# Patient Record
Sex: Female | Born: 1990 | Race: Black or African American | Hispanic: No | Marital: Single | Smoking: Never smoker
Health system: Southern US, Community
[De-identification: ages and names within clinical notes are randomized; demographics above are authoritative.]

## PROBLEM LIST (undated history)

## (undated) ENCOUNTER — Inpatient Hospital Stay (HOSPITAL_COMMUNITY): Payer: Self-pay

## (undated) DIAGNOSIS — R87629 Unspecified abnormal cytological findings in specimens from vagina: Secondary | ICD-10-CM

## (undated) DIAGNOSIS — I890 Lymphedema, not elsewhere classified: Secondary | ICD-10-CM

## (undated) DIAGNOSIS — O139 Gestational [pregnancy-induced] hypertension without significant proteinuria, unspecified trimester: Secondary | ICD-10-CM

---
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
## 2012-07-19 HISTORY — PX: CERVICAL BIOPSY: SHX590

## 2013-06-10 ENCOUNTER — Inpatient Hospital Stay (HOSPITAL_BASED_OUTPATIENT_CLINIC_OR_DEPARTMENT_OTHER)
Admission: EM | Admit: 2013-06-10 | Discharge: 2013-06-10 | Disposition: A | Discharge status: Home / Self Care | Payer: Managed Care, Other (non HMO) | Attending: Emergency Medicine | Admitting: Emergency Medicine

## 2013-06-10 ENCOUNTER — Inpatient Hospital Stay (HOSPITAL_COMMUNITY): Payer: Managed Care, Other (non HMO)

## 2013-06-10 DIAGNOSIS — R1031 Right lower quadrant pain: Secondary | ICD-10-CM | POA: Insufficient documentation

## 2013-06-10 DIAGNOSIS — O210 Mild hyperemesis gravidarum: Secondary | ICD-10-CM | POA: Insufficient documentation

## 2013-06-10 DIAGNOSIS — R5381 Other malaise: Secondary | ICD-10-CM | POA: Insufficient documentation

## 2013-06-10 DIAGNOSIS — O9989 Other specified diseases and conditions complicating pregnancy, childbirth and the puerperium: Secondary | ICD-10-CM | POA: Insufficient documentation

## 2013-06-10 DIAGNOSIS — Z3490 Encounter for supervision of normal pregnancy, unspecified, unspecified trimester: Secondary | ICD-10-CM

## 2013-06-10 DIAGNOSIS — N949 Unspecified condition associated with female genital organs and menstrual cycle: Secondary | ICD-10-CM

## 2013-06-10 DIAGNOSIS — R1032 Left lower quadrant pain: Secondary | ICD-10-CM | POA: Insufficient documentation

## 2013-06-10 DIAGNOSIS — R1084 Generalized abdominal pain: Secondary | ICD-10-CM

## 2013-06-10 DIAGNOSIS — A5609 Other chlamydial infection of lower genitourinary tract: Secondary | ICD-10-CM

## 2013-06-10 DIAGNOSIS — Z792 Long term (current) use of antibiotics: Secondary | ICD-10-CM | POA: Insufficient documentation

## 2013-06-10 DIAGNOSIS — O209 Hemorrhage in early pregnancy, unspecified: Secondary | ICD-10-CM | POA: Insufficient documentation

## 2013-06-10 LAB — URINALYSIS, ROUTINE W REFLEX MICROSCOPIC
Bilirubin Urine: NEGATIVE
Ketones, ur: 15 mg/dL — AB
Nitrite: NEGATIVE
Urobilinogen, UA: 1 mg/dL (ref 0.0–1.0)

## 2013-06-10 LAB — WET PREP, GENITAL
Trich, Wet Prep: NONE SEEN
Yeast Wet Prep HPF POC: NONE SEEN

## 2013-06-10 LAB — URINE MICROSCOPIC-ADD ON

## 2013-06-10 MED ORDER — COMPLETENATE 29-1 MG PO CHEW: 1.0000 | CHEWABLE_TABLET | Freq: Every day | ORAL | Status: AC

## 2013-06-10 NOTE — MAU Provider Note (Signed)
History     CSN: [id number]  Arrival date and time: 06/10/13 1501   None     Chief Complaint  Patient presents with  . Abdominal Pain   Abdominal Pain    Tammy Berg is a 22 y.o. who was seen at [facility] today. She had a complete physical exam, and labs. She was instructed to return to [facility] tomorrow for an outpatient ultrasound. She is here now, and reports cramping and bleeding. She states that the bleeding stopped two days ago.    Note and labs from [facility] fully reviewed.  No past medical history on file.  No past surgical history on file.  No family history on file.  History  Substance Use Topics  . Smoking status: Not on file  . Smokeless tobacco: Not on file  . Alcohol Use: Not on file    Allergies: No Known Allergies  Prescriptions prior to admission  Medication Sig Dispense Refill  . metroNIDAZOLE (FLAGYL) 500 MG tablet Take 500 mg by mouth 3 (three) times daily.        Review of Systems  Gastrointestinal: Positive for abdominal pain.   Physical Exam   Blood pressure 114/63, pulse 72, temperature 97.8 F (36.6 C), temperature source Oral, resp. rate 18, weight 91.627 kg (202 lb), last menstrual period 04/21/2013, SpO2 100.00%.  Physical Exam  Nursing note and vitals reviewed. Constitutional: She is oriented to person, place, and time. She appears well-developed and well-nourished. No distress.  Cardiovascular: Normal rate.   Respiratory: Effort normal.  Neurological: She is alert and oriented to person, place, and time.  Skin: Skin is warm and dry.  Psychiatric: She has a normal mood and affect.    MAU Course  Procedures  Results for orders placed during the hospital encounter of 06/10/13 (from the past 24 hour(s))  URINALYSIS, ROUTINE W REFLEX MICROSCOPIC     Status: Abnormal   Collection Time    06/10/13  3:15 PM      Result Value Range   Color, Urine AMBER (*) YELLOW   APPearance CLEAR  CLEAR   Specific Gravity, Urine 1.024  1.005 - 1.030   pH 6.0  5.0 - 8.0   Glucose, UA NEGATIVE  NEGATIVE mg/dL   Hgb urine dipstick NEGATIVE  NEGATIVE   Bilirubin Urine NEGATIVE  NEGATIVE   Ketones, ur 15 (*) NEGATIVE mg/dL   Protein, ur NEGATIVE  NEGATIVE mg/dL   Urobilinogen, UA 1.0  0.0 - 1.0 mg/dL   Nitrite NEGATIVE  NEGATIVE   Leukocytes, UA SMALL (*) NEGATIVE  PREGNANCY, URINE     Status: Abnormal   Collection Time    06/10/13  3:15 PM      Result Value Range   Preg Test, Ur POSITIVE (*) NEGATIVE  URINE MICROSCOPIC-ADD ON     Status: Abnormal   Collection Time    06/10/13  3:15 PM      Result Value Range   Squamous Epithelial / LPF RARE  RARE   WBC, UA 0-2  <3 WBC/hpf   Bacteria, UA MANY (*) RARE   Urine-Other MUCOUS PRESENT    WET PREP, GENITAL     Status: Abnormal   Collection Time    06/10/13  5:48 PM      Result Value Range   Yeast Wet Prep HPF POC NONE SEEN  NONE SEEN   Trich, Wet Prep NONE SEEN  NONE SEEN   Clue Cells Wet Prep HPF POC FEW (*) NONE SEEN   WBC, Wet Prep  HPF POC MANY (*) NONE SEEN  HCG, QUANTITATIVE, PREGNANCY     Status: Abnormal   Collection Time    06/10/13  5:55 PM      Result Value Range   hCG, Beta Chain, [place] [id number] (*) <5 mIU/mL  ABO/RH     Status: None   Collection Time    06/10/13  8:27 PM      Result Value Range   ABO/RH(D) O POS     US Ob Comp Less 14 Wks  06/10/2013   CLINICAL DATA:  First trimester pregnancy. Vaginal bleeding. LMP 04/21/2013.  EXAM: OBSTETRIC <14 WK ULTRASOUND  TECHNIQUE: Transabdominal ultrasound was performed for evaluation of the gestation as well as the maternal uterus and adnexal regions.  COMPARISON:  None.  FINDINGS: Intrauterine gestational sac: Visualized/normal in shape.  Yolk sac:  Visualized.  Embryo:  Visualized.  Cardiac Activity: Visualized.  Heart Rate: 118 bpm  CRL:   4.9  mm   6 w 2 d                  [place] EDC: 02/01/2014  Maternal uterus/adnexae: There is no evidence of subchorionic hematoma. Both maternal ovaries appear normal. There is no adnexal  mass or free pelvic fluid.  IMPRESSION: Single living intrauterine pregnancy with best estimated gestational age of [redacted] weeks 2 days. No acute findings demonstrated.   Electronically Signed   By: Roxy Horseman M.D.   On: 06/10/2013 21:03     Assessment and Plan   1. Pregnant   2. Pelvic pain complicating pregnancy, antepartum, first trimester    First trimester danger signs reviewed Start [facility] as soon as possible.   Tawnya Crook 06/10/2013, 9:00 PM

## 2013-06-10 NOTE — ED Notes (Signed)
Patient here with complaints of general abdominal pain with dysuria. Reports that the dysuria has now resolved. Nausea with some vomiting. Reports that she has just found out she was pregnant.

## 2013-06-10 NOTE — ED Provider Notes (Signed)
CSN: [id number]     Arrival date & time 06/10/13  1501 History   First MD Initiated Contact with Patient 06/10/13 1609     Chief Complaint  Patient presents with  . Abdominal Pain   (Consider location/radiation/quality/duration/timing/severity/associated sxs/prior Treatment) Patient is a 22 y.o. female presenting with abdominal pain and vaginal bleeding. The history is provided by the patient. No language interpreter was used.  Abdominal Pain Pain location:  LLQ and RLQ Pain quality: cramping   Pain radiates to:  Does not radiate Pain severity:  Moderate Onset quality:  Sudden Duration:  2 days Timing:  Intermittent Progression:  Partially resolved Chronicity:  New Context comment:  Pregnancy Relieved by:  None tried Worsened by:  Nothing tried Ineffective treatments:  None tried Associated symptoms: fatigue, nausea, vaginal bleeding and vomiting   Fatigue:    Severity:  Moderate   Duration:  4 days   Timing:  Constant   Progression:  Unchanged Nausea:    Severity:  Moderate   Onset quality:  Sudden   Duration:  3 days   Timing:  Constant   Progression:  Unchanged Vomiting:    Quality:  Stomach contents   Number of occurrences:  Mulitple times per day worse in mornings   Severity:  Moderate   Duration:  3 days   Timing:  Intermittent   Progression:  Unchanged (Pt has had vomiting since thursday that is worse in the morning but last throughout day) Risk factors: pregnancy   Vaginal Bleeding Quality:  Bright red and spotting Severity:  Mild Onset quality:  Sudden Duration:  2 hours Timing:  Rare Progression:  Resolved Chronicity:  New Menstrual history:  Missed period Number of pads used:  1 Number of tampons used:  0 Possible pregnancy: yes   Context: at rest   Relieved by:  None tried Worsened by:  Nothing tried Ineffective treatments:  None tried Associated symptoms: abdominal pain, fatigue and nausea   Associated symptoms comment:  PT had a positive at  home pregnancy test on Wednesday.  She had abdominal cramping with spotting for a few hours on Friday. This has resolved at this time. Fatigue:    Severity:  Moderate   Duration:  4 days   Timing:  Constant   Progression:  Unchanged (Pt has had increased fatigue for 4 days) Risk factors: no bleeding disorder and no hx of ectopic pregnancy     No past medical history on file. No past surgical history on file. No family history on file. History  Substance Use Topics  . Smoking status: Not on file  . Smokeless tobacco: Not on file  . Alcohol Use: Not on file   OB History   No data available     Review of Systems  Constitutional: Positive for fatigue.  Gastrointestinal: Positive for nausea, vomiting and abdominal pain.  Genitourinary: Positive for vaginal bleeding.    Allergies  Review of patient's allergies indicates no known allergies.  Home Medications   Current Outpatient Rx  Name  Route  Sig  Dispense  Refill  . metroNIDAZOLE (FLAGYL) 500 MG tablet   Oral   Take 500 mg by mouth 3 (three) times daily.          BP 124/79  Pulse 71  Temp(Src) 97.8 F (36.6 C) (Oral)  Resp 18  Wt 202 lb (91.627 kg)  SpO2 100%  LMP 04/21/2013 Physical Exam  Constitutional: She is oriented to person, place, and time. She appears well-developed and well-nourished. No  distress.  HENT:  Head: Normocephalic and atraumatic.  Neck: Normal range of motion. Neck supple.  Cardiovascular: Normal rate, regular rhythm and normal heart sounds.   Pulmonary/Chest: Effort normal and breath sounds normal.  Abdominal: Soft. Bowel sounds are normal. She exhibits no mass. There is no tenderness. There is no rebound and no guarding.  Musculoskeletal: Normal range of motion.  Neurological: She is alert and oriented to person, place, and time.  Skin: Skin is warm and dry.  Psychiatric: She has a normal mood and affect. Her behavior is normal. Judgment and thought content normal.    ED Course   Pelvic exam Date/Time: 06/10/2013 4:49 PM Performed by: Elpidio Anis A Authorized by: Elpidio Anis A Consent: Verbal consent obtained. Risks and benefits: risks, benefits and alternatives were discussed Consent given by: patient Patient understanding: patient states understanding of the procedure being performed Patient consent: the patient's understanding of the procedure matches consent given Procedure consent: procedure consent matches procedure scheduled Relevant documents: relevant documents present and verified Test results: test results available and properly labeled Site marked: the operative site was not marked Imaging studies: imaging studies not available Required items: required blood products, implants, devices, and special equipment available Patient identity confirmed: verbally with patient and arm band Local anesthesia used: no Patient sedated: no Patient tolerance: Patient tolerated the procedure well with no immediate complications.   (including critical care time) Labs Review Labs Reviewed  URINALYSIS, ROUTINE W REFLEX MICROSCOPIC - Abnormal; Notable for the following:    Color, Urine AMBER (*)    Ketones, ur 15 (*)    Leukocytes, UA SMALL (*)    All other components within normal limits  PREGNANCY, URINE - Abnormal; Notable for the following:    Preg Test, Ur POSITIVE (*)    All other components within normal limits  URINE MICROSCOPIC-ADD ON - Abnormal; Notable for the following:    Bacteria, UA MANY (*)    All other components within normal limits   Imaging Review No results found.  EKG Interpretation   None       MDM  No diagnosis found. 1. Pregnant   She has no bleeding now - last bleeding was 2 days ago. No pelvic pain. Discussed results with patient and family - discussed importance of going to [facility] tomorrow for ultrasound given history of recent bleeding/cramping.     Arnoldo Hooker, PA-C 06/10/13 1913

## 2013-06-10 NOTE — ED Provider Notes (Signed)
Medical screening examination/treatment/procedure(s) were performed by non-physician practitioner and as supervising physician I was immediately available for consultation/collaboration.  EKG Interpretation   None         Charles B. Bernette Mayers, MD 06/10/13 [phone]

## 2013-06-11 NOTE — MAU Provider Note (Signed)
Attestation of Attending Supervision of Advanced Practitioner (CNM/NP): Evaluation and management procedures were performed by the Advanced Practitioner under my supervision and collaboration. I have reviewed the Advanced Practitioner's note and chart, and I agree with the management and plan.  Foye Damron H. 6:20 AM

## 2013-06-12 LAB — GC/CHLAMYDIA PROBE AMP: CT Probe RNA: POSITIVE — AB

## 2013-11-28 ENCOUNTER — Encounter (HOSPITAL_COMMUNITY): Payer: Self-pay

## 2013-11-28 ENCOUNTER — Inpatient Hospital Stay (HOSPITAL_COMMUNITY)
Admission: AD | Admit: 2013-11-28 | Discharge: 2013-11-28 | Disposition: A | Discharge status: Home / Self Care | Payer: Managed Care, Other (non HMO) | Source: Ambulatory Visit | Attending: Obstetrics & Gynecology | Admitting: Obstetrics & Gynecology

## 2013-11-28 ENCOUNTER — Ambulatory Visit (HOSPITAL_COMMUNITY)
Admission: RE | Admit: 2013-11-28 | Discharge: 2013-11-28 | Disposition: A | Discharge status: Home / Self Care | Payer: Managed Care, Other (non HMO) | Source: Ambulatory Visit | Attending: Obstetrics & Gynecology | Admitting: Obstetrics & Gynecology

## 2013-11-28 DIAGNOSIS — M7989 Other specified soft tissue disorders: Secondary | ICD-10-CM

## 2013-11-28 DIAGNOSIS — IMO0002 Reserved for concepts with insufficient information to code with codable children: Secondary | ICD-10-CM | POA: Insufficient documentation

## 2013-11-28 DIAGNOSIS — R609 Edema, unspecified: Secondary | ICD-10-CM | POA: Diagnosis present

## 2013-11-28 DIAGNOSIS — R600 Localized edema: Secondary | ICD-10-CM

## 2013-11-28 DIAGNOSIS — G43909 Migraine, unspecified, not intractable, without status migrainosus: Secondary | ICD-10-CM | POA: Insufficient documentation

## 2013-11-28 DIAGNOSIS — R51 Headache: Secondary | ICD-10-CM

## 2013-11-28 DIAGNOSIS — O99891 Other specified diseases and conditions complicating pregnancy: Secondary | ICD-10-CM | POA: Insufficient documentation

## 2013-11-28 DIAGNOSIS — O9989 Other specified diseases and conditions complicating pregnancy, childbirth and the puerperium: Secondary | ICD-10-CM

## 2013-11-28 HISTORY — DX: Unspecified abnormal cytological findings in specimens from vagina: R87.629

## 2013-11-28 HISTORY — DX: Lymphedema, not elsewhere classified: I89.0

## 2013-11-28 HISTORY — DX: Gestational (pregnancy-induced) hypertension without significant proteinuria, unspecified trimester: O13.9

## 2013-11-28 MED ORDER — DEXAMETHASONE 0.5 MG PO TABS
1.0000 mg | ORAL_TABLET | ORAL | Status: DC
  Filled 2013-11-28: qty 2

## 2013-11-28 MED ORDER — ENOXAPARIN SODIUM 40 MG/0.4ML ~~LOC~~ SOLN
40.0000 mg | SUBCUTANEOUS | Status: AC
  Administered 2013-11-28: 40 mg via SUBCUTANEOUS
  Filled 2013-11-28: qty 0.4

## 2013-11-28 MED ORDER — DEXAMETHASONE 0.5 MG PO TABS
1.0000 mg | ORAL_TABLET | ORAL | Status: AC
  Administered 2013-11-28: 1 mg via ORAL
  Filled 2013-11-28: qty 2

## 2013-11-28 MED ORDER — DIPHENHYDRAMINE HCL 25 MG PO CAPS
25.0000 mg | ORAL_CAPSULE | ORAL | Status: AC
  Administered 2013-11-28: 25 mg via ORAL
  Filled 2013-11-28: qty 1

## 2013-11-28 MED ORDER — METOCLOPRAMIDE HCL 10 MG PO TABS
10.0000 mg | ORAL_TABLET | ORAL | Status: AC
  Administered 2013-11-28: 10 mg via ORAL
  Filled 2013-11-28: qty 1

## 2013-11-28 NOTE — MAU Note (Signed)
LLE swelling; history of lymphedema; swelling worse tonight. Headache tonight. Denies spots in vision. Irregular contractions. Denies vaginal bleeding/LOF/discharge. positive fetal movement. IOL with first pregnancy d/t pre E. [place]ren[place] [place] [facility].

## 2013-11-28 NOTE — Progress Notes (Signed)
VASCULAR LAB PRELIMINARY  PRELIMINARY  PRELIMINARY  PRELIMINARY  Left lower extremity venous duplex completed.    Preliminary report:  Left:  No evidence of DVT, superficial thrombosis, or Baker's cyst.  Tammy HanlonVirginia D Shanele Berg, RVS 11/28/2013, 1:43 PM

## 2013-11-28 NOTE — MAU Provider Note (Signed)
Chief Complaint:  Leg Swelling and Headache   First Provider Initiated Contact with Patient 11/28/13 0206      HPI: Tammy Berg is a 23 y.o. G2P1001 at 3931w4dwho presents to maternity admissions reporting left extremity swelling and migraine headache.  She has a hx of lymphedema and always has more swelling in her left leg versus her right, but was swollen when she left work this morning, then it became progressively worse while she was sleeping this afternoon. She reports that the swelling is worse than it has ever been today.  She denies pain in her legs.  She reports a headache off and on all day and has a hx of migraines.  She has taken Tylenol and Fioricet for h/a in this pregnancy but has not taken any medication today.  She reports good fetal movement, denies abdominal pain, LOF, vaginal bleeding, vaginal itching/burning, urinary symptoms, h/a, dizziness, n/v, or fever/chills.     Past Medical History: Past Medical History  Diagnosis Date  . Lymphedema   . Pregnancy induced hypertension   . Vaginal Pap smear, abnormal     Past obstetric history: OB History  Gravida Para Term Preterm AB SAB TAB Ectopic Multiple Living  2 1 1       1     # Outcome Date GA Lbr Len/2nd Weight Sex Delivery Anes PTL Lv  2 CUR           1 TRM 2009 [id number]    SVD   Y     Comments: IOL for preE      Past Surgical History: Past Surgical History  Procedure Laterality Date  . Cervical biopsy  2014  . Incision and drainage abscess  2012    back of throat    Family History: No family history on file.  Social History: History  Substance Use Topics  . Smoking status: Never Smoker   . Smokeless tobacco: Not on file  . Alcohol Use: No    Allergies: No Known Allergies  Meds:  Prescriptions prior to admission  Medication Sig Dispense Refill  . acetaminophen (TYLENOL) 500 MG tablet Take 1,000 mg by mouth every 6 (six) hours as needed for headache.      . prenatal vitamin w/FE, FA (NATACHEW)  29-1 MG CHEW chewable tablet Chew 1 tablet by mouth daily at 12 noon.  30 tablet  11    ROS: Pertinent findings in history of present illness.  Physical Exam  Blood pressure 121/71, pulse 77, temperature 98.1 F (36.7 C), temperature source Oral, resp. rate 20, height 5' 5.5" (1.664 m), weight 96.163 kg (212 lb), last menstrual period 04/21/2013. GENERAL: Well-developed, well-nourished female in no acute distress.  HEENT: normocephalic HEART: normal rate, rhythm, heart sounds RESP: normal effort, lung sounds clear and equal bilaterally ABDOMEN: Soft, non-tender, gravid appropriate for gestational age EXTREMITIES: Nontender, 3+ nonpitting edema of LLE, trace edema in RLE, pedal pulses present on right and left. Negative Homan's sign bilaterally, cool to touch bilaterally. NEURO: alert and oriented    FHT:  Baseline 135, moderate variability, accelerations present, no decelerations Contractions: None on toco or to palpation    Assessment: 1. Edema of left lower extremity   2. Migraine     Plan: PO Benadryl 25 mg, Reglan 10 mg, Decadron 1 mg given for h/a.  Pt reports improvement in MAU.   Lovenox 40 mg x1 dose in MAU Discharge home Call to schedule venous duplex tomorrow F/U with [facility] [place] [facility]  Follow-up Information   Follow up with [facility]. Call today. (To make appointment for venous doppler of left leg.  )    Specialty:  Radiology   Contact information:   [address] [id number] [place] [address] [phone]      Follow [facility] with [facility]. (As needed for emergencies)    Contact information:   [address] [id number] [place] [id number] [phone]       Medication List         acetaminophen 500 MG tablet  Commonly known as:  TYLENOL  Take 1,000 mg by mouth every 6 (six) hours as needed for headache.     prenatal  vitamin w/FE, FA 29-1 MG Chew chewable tablet  Chew 1 tablet by mouth daily at 12 noon.        Sharen CounterLisa Leftwich-Kirby Certified Nurse-Midwife 11/28/2013 2:47 AM

## 2013-11-28 NOTE — Discharge Instructions (Signed)

## 2013-11-29 NOTE — MAU Provider Note (Signed)

## 2014-05-20 ENCOUNTER — Encounter (HOSPITAL_COMMUNITY): Payer: Self-pay

## 2014-10-03 ENCOUNTER — Encounter (HOSPITAL_COMMUNITY): Payer: Self-pay | Admitting: *Deleted

## 2014-10-18 IMAGING — US US OB COMP LESS 14 WK
1 series · 14 of 20 positions shown · non-contrast
Comparison: None.

CLINICAL DATA: First trimester pregnancy. Vaginal bleeding. LMP
04/21/2013.

EXAM:
OBSTETRIC <14 WK ULTRASOUND
TECHNIQUE: Transabdominal ultrasound was performed for evaluation of the
gestation as well as the maternal uterus and adnexal regions.

[Series 1: us ob comp less 14 wks · 20 acquisitions, 14 frames shown]
[im 1/20]
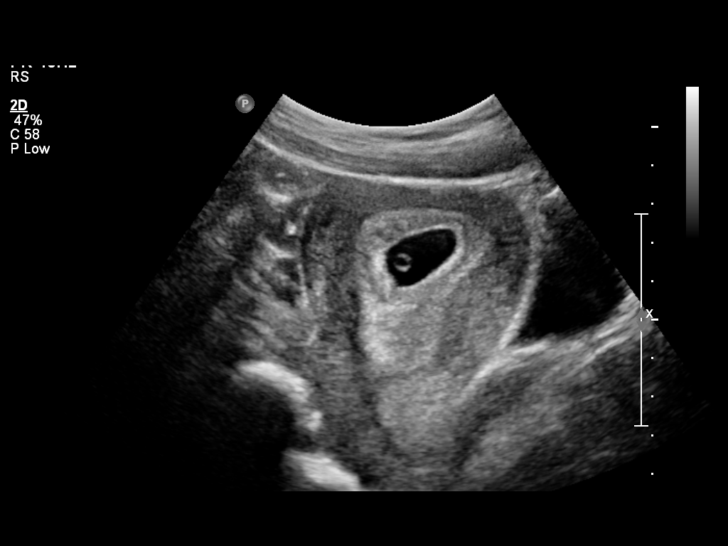
[im 3/20]
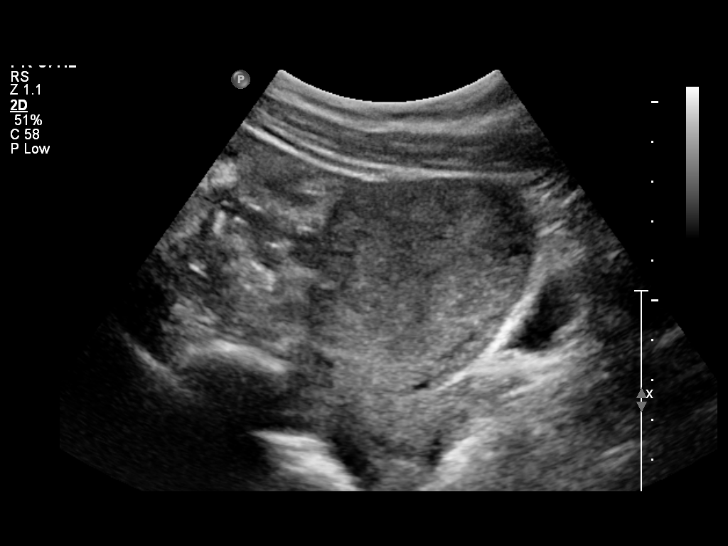
[im 4/20]
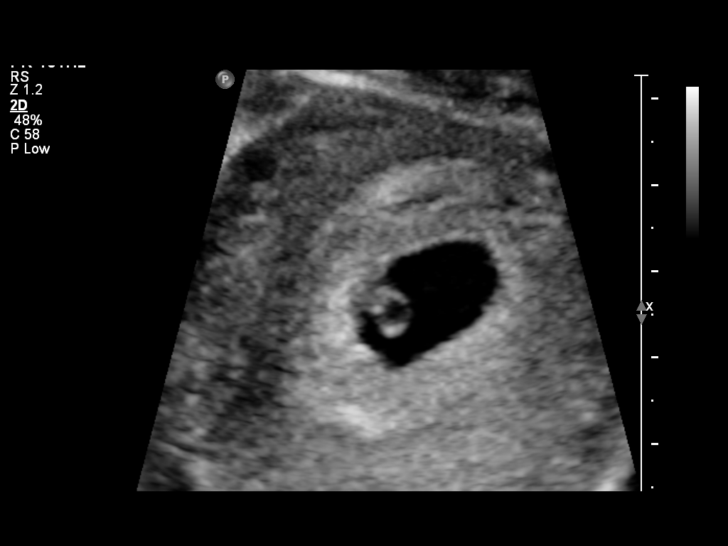
[im 6/20]
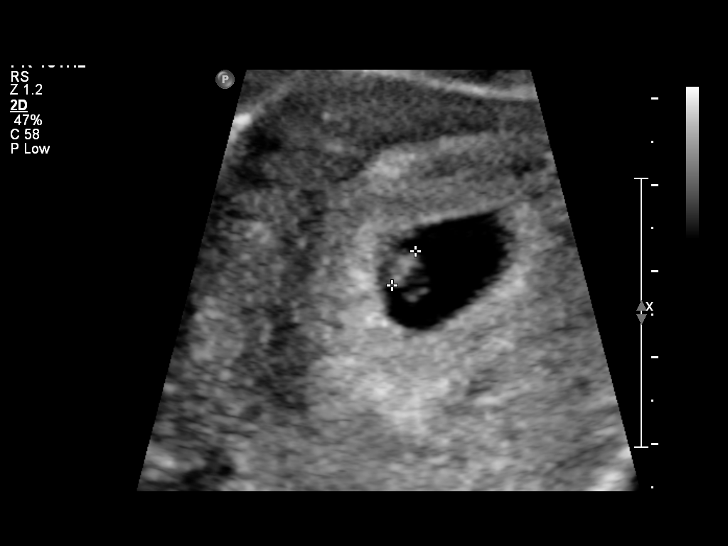
[im 7/20]
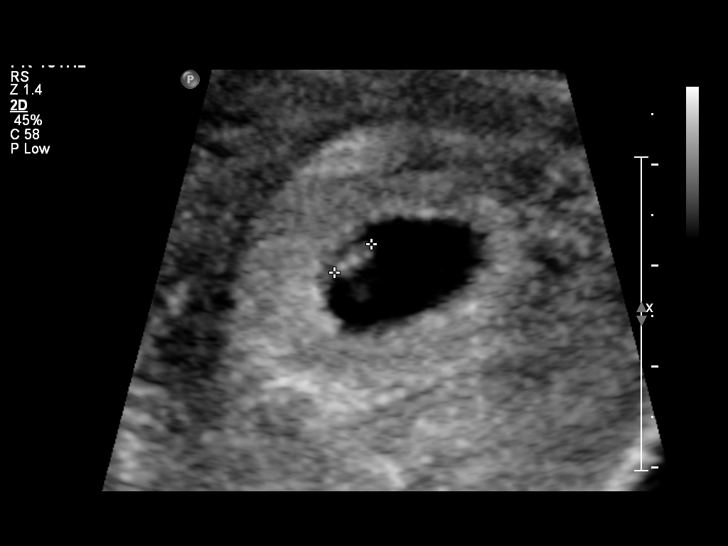
[im 8/20]
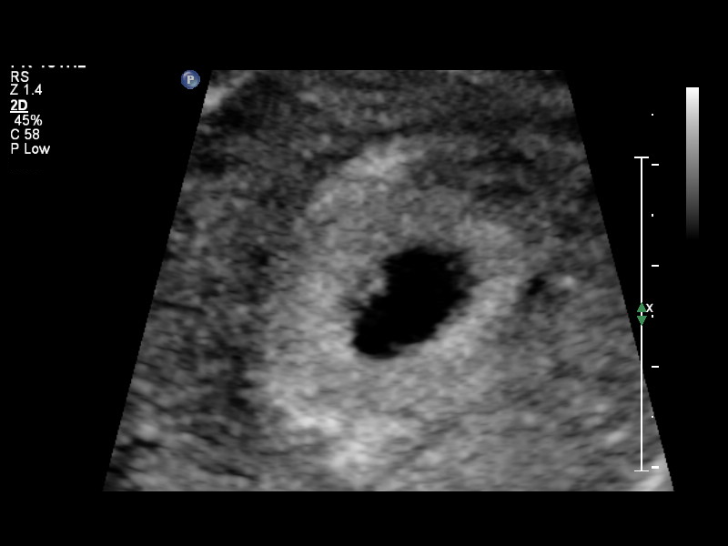
[im 10/20]
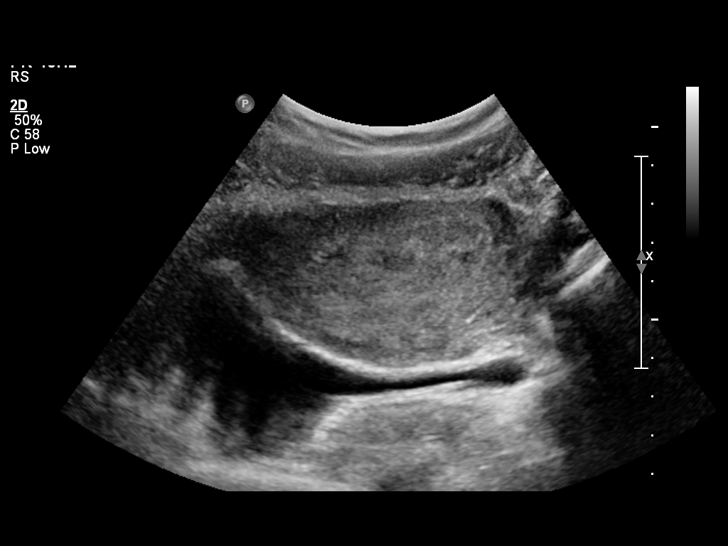
[im 11/20]
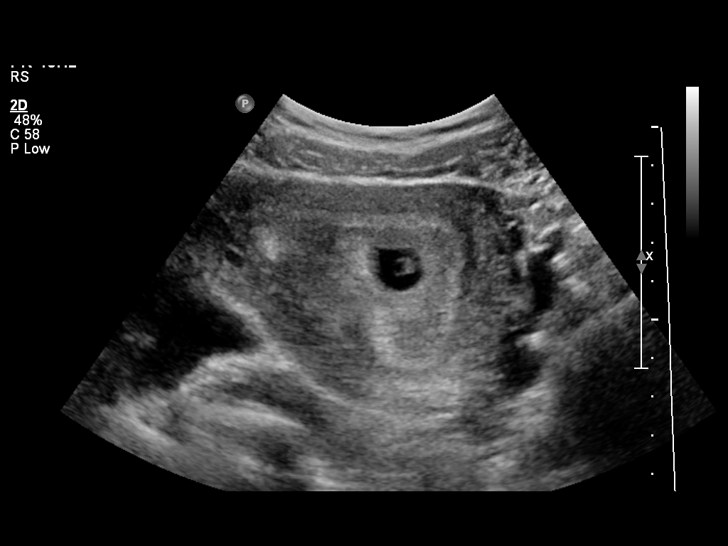
[im 13/20]
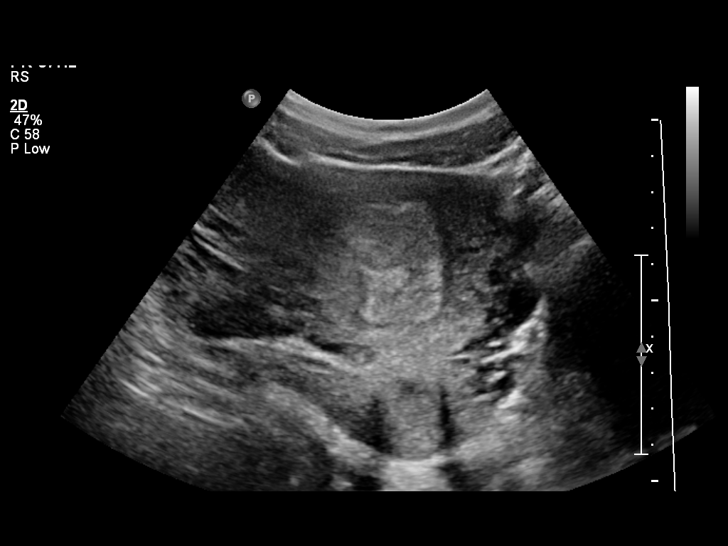
[im 14/20]
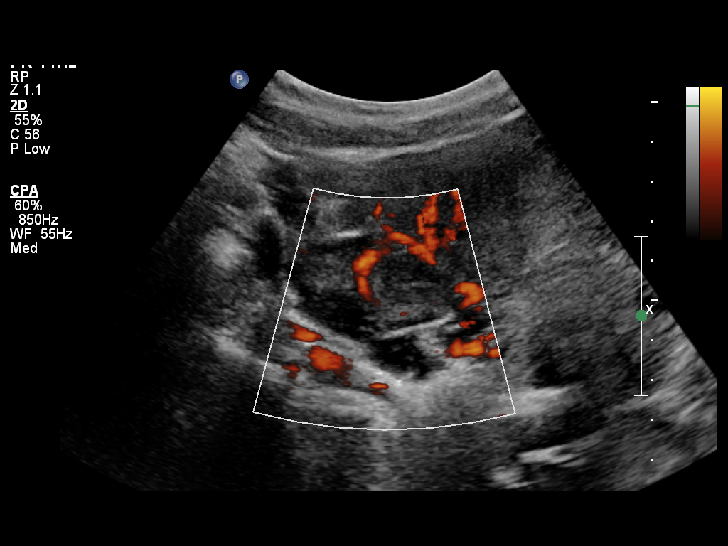
[im 16/20]
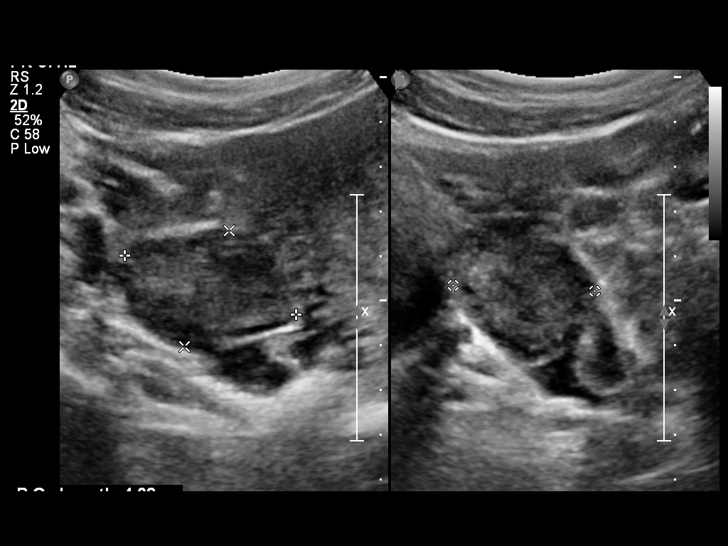
[im 17/20]
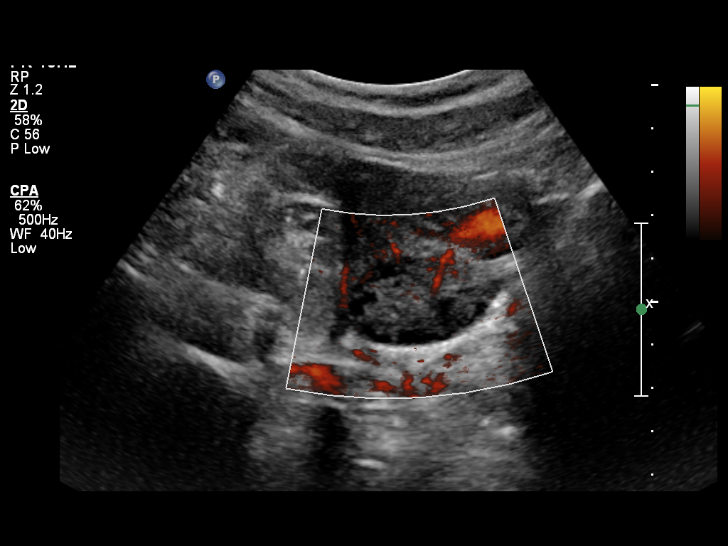
[im 18/20]
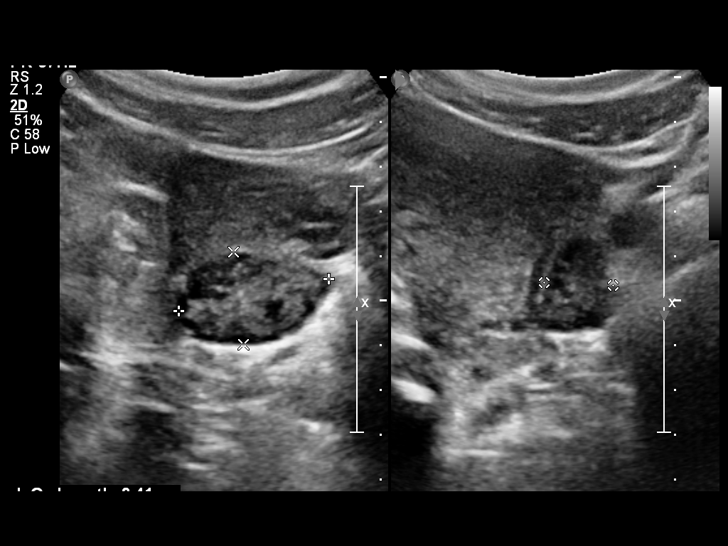
[im 20/20]
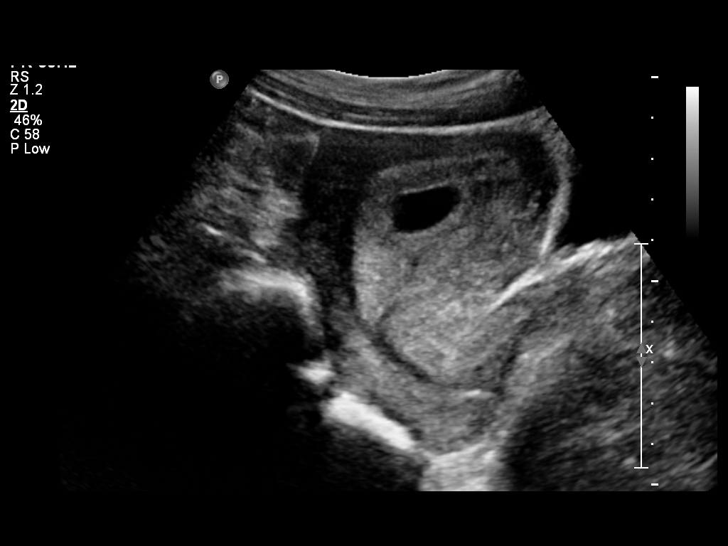

[14 of 20 positions shown; findings below may reference images not displayed]

FINDINGS: Intrauterine gestational sac: Visualized/normal in shape.

Yolk sac:  Visualized.

Embryo:  Visualized.

Cardiac Activity: Visualized.

Heart Rate: 118 bpm

CRL:   4.9  mm   6 w 2 d                  US EDC: 02/01/2014

Maternal uterus/adnexae: There is no evidence of subchorionic
hematoma. Both maternal ovaries appear normal. There is no adnexal
mass or free pelvic fluid.
IMPRESSION: Single living intrauterine pregnancy with best estimated gestational
age of 6 weeks 2 days. No acute findings demonstrated.

## 2015-08-13 ENCOUNTER — Emergency Department (HOSPITAL_BASED_OUTPATIENT_CLINIC_OR_DEPARTMENT_OTHER): Payer: Managed Care, Other (non HMO)

## 2015-08-13 ENCOUNTER — Encounter (HOSPITAL_BASED_OUTPATIENT_CLINIC_OR_DEPARTMENT_OTHER): Payer: Self-pay | Admitting: Emergency Medicine

## 2015-08-13 ENCOUNTER — Emergency Department (HOSPITAL_BASED_OUTPATIENT_CLINIC_OR_DEPARTMENT_OTHER)
Admission: EM | Admit: 2015-08-13 | Discharge: 2015-08-14 | Disposition: A | Discharge status: Home / Self Care | Payer: Managed Care, Other (non HMO) | Attending: Emergency Medicine | Admitting: Emergency Medicine

## 2015-08-13 DIAGNOSIS — S5002XA Contusion of left elbow, initial encounter: Secondary | ICD-10-CM | POA: Insufficient documentation

## 2015-08-13 DIAGNOSIS — Y998 Other external cause status: Secondary | ICD-10-CM | POA: Diagnosis not present

## 2015-08-13 DIAGNOSIS — S40022A Contusion of left upper arm, initial encounter: Secondary | ICD-10-CM | POA: Diagnosis not present

## 2015-08-13 DIAGNOSIS — Z79899 Other long term (current) drug therapy: Secondary | ICD-10-CM | POA: Insufficient documentation

## 2015-08-13 DIAGNOSIS — S59912A Unspecified injury of left forearm, initial encounter: Secondary | ICD-10-CM | POA: Diagnosis present

## 2015-08-13 DIAGNOSIS — Z8679 Personal history of other diseases of the circulatory system: Secondary | ICD-10-CM | POA: Insufficient documentation

## 2015-08-13 DIAGNOSIS — Y9289 Other specified places as the place of occurrence of the external cause: Secondary | ICD-10-CM | POA: Diagnosis not present

## 2015-08-13 DIAGNOSIS — Y9389 Activity, other specified: Secondary | ICD-10-CM | POA: Diagnosis not present

## 2015-08-13 MED ORDER — ACETAMINOPHEN 500 MG PO TABS
1000.0000 mg | ORAL_TABLET | Freq: Once | ORAL | Status: AC
Start: 2015-08-14 — End: 2015-08-14
  Administered 2015-08-14: 1000 mg via ORAL
  Filled 2015-08-13: qty 2

## 2015-08-13 NOTE — ED Provider Notes (Signed)
By signing my name below, I, Linus Galas, attest that this documentation has been prepared under the direction and in the presence of [employer], DO. Electronically Signed: Linus Galas, ED Scribe. 08/13/2015. 11:56 PM.  TIME SEEN: 11:56 PM  CHIEF COMPLAINT:  Chief Complaint  Patient presents with  . Arm Injury    HPI: HPI Comments: Tammy Berg, R hand dominant is a 25 y.o. female with no PMHx who presents to the Emergency Department complaining of LUE pain and associated swelling s/p fall 1 hours, PTA. Pt states she fell in a ditch when she slipped on mind getting out of her car hitting her arm on concrete. Pt's pain is exacerbated with movement with no alleviating factors. Pt has not tried any OTC medication or home remedies for her sx. Pt denies any head injury, LOC, or any other sx. no neck or back pain.  ROS: See HPI Constitutional: no fever  Eyes: no drainage  ENT: no runny nose   Cardiovascular:  no chest pain  Resp: no SOB  GI: no vomiting GU: no dysuria Integumentary: no rash  Allergy: no hives  Musculoskeletal: no leg swelling  Neurological: no slurred speech ROS otherwise negative  PAST MEDICAL HISTORY/PAST SURGICAL HISTORY:  Past Medical History  Diagnosis Date  . Lymphedema   . Pregnancy induced hypertension   . Vaginal Pap smear, abnormal     MEDICATIONS:  Prior to Admission medications   Medication Sig Start Date End Date Taking? Authorizing Provider  acetaminophen (TYLENOL) 500 MG tablet Take 1,000 mg by mouth every 6 (six) hours as needed for headache.    Historical Provider, MD  prenatal vitamin w/FE, FA (NATACHEW) 29-1 MG CHEW chewable tablet Chew 1 tablet by mouth daily at 12 noon. 06/10/13   Armando Reichert, CNM    ALLERGIES:  No Known Allergies  SOCIAL HISTORY:  Social History  Substance Use Topics  . Smoking status: Never Smoker   . Smokeless tobacco: Not on file  . Alcohol Use: No    FAMILY HISTORY: No family history on  file.  EXAM: BP 128/87 mmHg  Pulse 72  Temp(Src) 97.9 F (36.6 C) (Oral)  Resp 18  Ht  (1.651 m)  Wt 180 lb (81.647 kg)  BMI 29.95 kg/m2  SpO2 100%  LMP 08/05/2015 (Approximate) CONSTITUTIONAL: Alert and oriented and responds appropriately to questions. Well-appearing; well-nourished HEAD: Normocephalic, atraumatic EYES: Conjunctivae clear, PERRL ENT: normal nose; no rhinorrhea; moist mucous membranes; pharynx without lesions noted NECK: Supple, no meningismus, no LAD, no midline spinal tenderness, step-off or deformity CARD: RRR; S1 and S2 appreciated; no murmurs, no clicks, no rubs, no gallops CHEST: chest wall non-tender to palpation, no crepitus, or deformity  RESP: Normal chest excursion without splinting or tachypnea; breath sounds clear and equal bilaterally; no wheezes, no rhonchi, no rales, no hypoxia or respiratory distress, speaking full sentences ABD/GI: Normal bowel sounds; non-distended; soft, non-tender, no rebound, no guarding, no peritoneal signs BACK:  The back appears normal and is non-tender to palpation, there is no CVA tenderness, no midline spinal tenderness, step-off or deformity EXT: TTP over the mid and distal left humerus and left elbow with small amount of soft tissue swelling and ecchymosis, no deformity, no joint effusion, full extension of elbow but cannot flex past 90 degrees due to pain, no sign of dislocation of elbow, no tenderness to the left shoulder, left hand, and left wrist. Normal left grip strength, 2+ left radial pulse. no edema; normal capillary refill; no  cyanosis, no calf tenderness or swelling, otherwise extremities are non-tender and normal ROM SKIN: Normal color for age and race; warm NEURO: Moves all extremities equally, sensation to light touch intact diffusely, cranial nerves II through XII intact, normal gait PSYCH: The patient's mood and manner are appropriate. Grooming and personal hygiene are appropriate.  MEDICAL DECISION  MAKING: Patient here for mechanical fall. Complaining of left elbow and distal left humerus pain. Neurovascularly intact distally. X-rays show fracture dislocation. She is requesting a sling for comfort. Have advised her take her arm out of her sling regularly and move her arms that she does not develop stiff joints, adhesive capsulitis. We'll discharge with ibuprofen for pain control instructions for rest, elevation and ice. No other injuries. She did not hit her head. She is neurologically intact. Discussed return precautions. She verbalizes understanding and is comfortable with this plan.  I personally performed the services described in this documentation, which was scribed in my presence. The recorded information has been reviewed and is accurate.      Layla Maw Rockwell Zentz, DO 08/14/15 0145

## 2015-08-13 NOTE — ED Notes (Signed)
Pt slipped on mud about 45 min ago and hit her left arm on cement.  Pain to upper and lower arm.  Good ROM with elbow.

## 2015-08-14 ENCOUNTER — Emergency Department (HOSPITAL_BASED_OUTPATIENT_CLINIC_OR_DEPARTMENT_OTHER): Payer: Managed Care, Other (non HMO)

## 2015-08-14 DIAGNOSIS — S40022A Contusion of left upper arm, initial encounter: Secondary | ICD-10-CM | POA: Diagnosis not present

## 2015-08-14 MED ORDER — IBUPROFEN 800 MG PO TABS: 800.0000 mg | ORAL_TABLET | Freq: Three times a day (TID) | ORAL | Status: AC | PRN

## 2015-08-14 NOTE — ED Notes (Signed)
Slipped and fell on left arm  C/o pain

## 2015-08-14 NOTE — Discharge Instructions (Signed)

## 2016-06-24 ENCOUNTER — Inpatient Hospital Stay (HOSPITAL_COMMUNITY)
Admission: AD | Admit: 2016-06-24 | Discharge: 2016-06-25 | Disposition: A | Discharge status: Home / Self Care | Payer: Managed Care, Other (non HMO) | Source: Ambulatory Visit | Attending: Obstetrics and Gynecology | Admitting: Obstetrics and Gynecology

## 2016-06-24 ENCOUNTER — Inpatient Hospital Stay (HOSPITAL_COMMUNITY): Payer: Managed Care, Other (non HMO)

## 2016-06-24 ENCOUNTER — Encounter (HOSPITAL_COMMUNITY): Payer: Self-pay | Admitting: *Deleted

## 2016-06-24 DIAGNOSIS — Z3A26 26 weeks gestation of pregnancy: Secondary | ICD-10-CM | POA: Diagnosis not present

## 2016-06-24 DIAGNOSIS — O26892 Other specified pregnancy related conditions, second trimester: Secondary | ICD-10-CM | POA: Diagnosis present

## 2016-06-24 DIAGNOSIS — O26872 Cervical shortening, second trimester: Secondary | ICD-10-CM | POA: Insufficient documentation

## 2016-06-24 LAB — URINALYSIS, ROUTINE W REFLEX MICROSCOPIC
Bilirubin Urine: NEGATIVE
GLUCOSE, UA: NEGATIVE mg/dL
Hgb urine dipstick: NEGATIVE
KETONES UR: NEGATIVE mg/dL
LEUKOCYTES UA: NEGATIVE
NITRITE: NEGATIVE
PROTEIN: NEGATIVE mg/dL
Specific Gravity, Urine: 1.027 (ref 1.005–1.030)
pH: 5 (ref 5.0–8.0)

## 2016-06-24 LAB — WET PREP, GENITAL
CLUE CELLS WET PREP: NONE SEEN
Sperm: NONE SEEN
Trich, Wet Prep: NONE SEEN
Yeast Wet Prep HPF POC: NONE SEEN

## 2016-06-24 MED ORDER — BETAMETHASONE SOD PHOS & ACET 6 (3-3) MG/ML IJ SUSP
12.0000 mg | Freq: Once | INTRAMUSCULAR | Status: AC
  Administered 2016-06-25: 12 mg via INTRAMUSCULAR
  Filled 2016-06-24: qty 2

## 2016-06-24 MED ORDER — MAGNESIUM SULFATE 50 % IJ SOLN
2.0000 g/h | INTRAVENOUS | Status: DC
  Administered 2016-06-25: 2 g/h via INTRAVENOUS
  Filled 2016-06-24: qty 80

## 2016-06-24 MED ORDER — LACTATED RINGERS IV SOLN
INTRAVENOUS | Status: DC
  Administered 2016-06-25: 50 mL/h via INTRAVENOUS

## 2016-06-24 MED ORDER — MAGNESIUM SULFATE BOLUS VIA INFUSION
4.0000 g | Freq: Once | INTRAVENOUS | Status: AC
  Administered 2016-06-25: 4 g via INTRAVENOUS
  Filled 2016-06-24: qty 500

## 2016-06-24 NOTE — MAU Note (Signed)
Pt reports pelvic pressure x one week and discharge with some blood in it.

## 2016-06-24 NOTE — H&P (Signed)
error 

## 2016-06-24 NOTE — MAU Provider Note (Signed)
None     Chief Complaint:     Tammy Berg is  25 y.o. G3P2002 at 1861w4d presents complaining of pelvic pressure for a week and mucous discharge w/spotting for a few days.  Denies contractions.  Last intercourse was last night.  [facility] [place]. States that about a month ago, an US revealed she had a "short cervix" (doesn't know the measurement). Was placed on prometrium, used it twice, then stopped because "they don't know what they are doing."  Says she came to that conclusion after a subsequent scan showed that her cervix "was now long".  Has an appt this week for an US to measure her cx.  Had intercourse last night, so unable to run a fetal fibronectin.  Requests GC/CHL.   Obstetrical/Gynecological History: OB History    Gravida Para Term Preterm AB Living   3 2 2     2    SAB TAB Ectopic Multiple Live Births           2     Past Medical History: Past Medical History:  Diagnosis Date  . Lymphedema   . Pregnancy induced hypertension   . Vaginal Pap smear, abnormal     Past Surgical History: Past Surgical History:  Procedure Laterality Date  . CERVICAL BIOPSY  2014  . INCISION AND DRAINAGE ABSCESS  2012   back of throat    Family History: History reviewed. No pertinent family history.  Social History: Social History  Substance Use Topics  . Smoking status: Never Smoker  . Smokeless tobacco: Not on file  . Alcohol use No    Allergies: No Known Allergies  Meds:  Prescriptions Prior to Admission  Medication Sig Dispense Refill Last Dose  . acetaminophen (TYLENOL) 500 MG tablet Take 1,000 mg by mouth every 6 (six) hours as needed for headache.   Past Week at Unknown time  . ibuprofen (ADVIL,MOTRIN) 800 MG tablet Take 1 tablet (800 mg total) by mouth every 8 (eight) hours as needed for mild pain. 30 tablet 0   . prenatal vitamin w/FE, FA (NATACHEW) 29-1 MG CHEW chewable tablet Chew 1 tablet by mouth daily at 12 noon. 30 tablet 11 11/27/2013 at Unknown time     Review of Systems   Constitutional: Negative for fever and chills Eyes: Negative for visual disturbances Respiratory: Negative for shortness of breath, dyspnea Cardiovascular: Negative for chest pain or palpitations  Gastrointestinal: Negative for vomiting, diarrhea and constipation Genitourinary: Negative for dysuria and urgency Musculoskeletal: Negative for back pain, joint pain, myalgias.  Normal ROM  Neurological: Negative for dizziness and headaches    Physical Exam  Blood pressure 130/73, pulse 73, temperature 97.9 F (36.6 C), temperature source Oral, resp. rate 18, last menstrual period 08/05/2015, SpO2 100 %, unknown if currently breastfeeding. GENERAL: Well-developed, well-nourished female in no acute distress.  LUNGS: Clear to auscultation bilaterally.  HEART: Regular rate and rhythm. ABDOMEN: Soft, nontender, nondistended, gravid.  EXTREMITIES: Nontender, no edema, 2+ distal pulses. DTR's 2+ PELVIC:  Mucous discharge, no blood or odor CERVICAL EXAM: Dilatation 2cm   Effacement 80%   Station -3  RECHECK 90 minutes later reveals no change. Presentation: BREECH FHT:  Baseline rate 150 bpm   Variability moderate  Accelerations present   Decelerations none Contractions: Every 0 mins   Labs: Results for orders placed or performed during the hospital encounter of 06/24/16 (from the past 24 hour(s))  Urinalysis, Routine w reflex microscopic   Collection Time: 06/24/16  9:52 PM  Result Value Ref Range   Color, Urine YELLOW YELLOW   APPearance CLEAR CLEAR   Specific Gravity, Urine 1.027 1.005 - 1.030   pH 5.0 5.0 - 8.0   Glucose, UA NEGATIVE NEGATIVE mg/dL   Hgb urine dipstick NEGATIVE NEGATIVE   Bilirubin Urine NEGATIVE NEGATIVE   Ketones, ur NEGATIVE NEGATIVE mg/dL   Protein, ur NEGATIVE NEGATIVE mg/dL   Nitrite NEGATIVE NEGATIVE   Leukocytes, UA NEGATIVE NEGATIVE  Wet prep, genital   Collection Time: 06/24/16 10:15 PM  Result Value Ref Range   Yeast Wet Prep  HPF POC NONE SEEN NONE SEEN   Trich, Wet Prep NONE SEEN NONE SEEN   Clue Cells Wet Prep HPF POC NONE SEEN NONE SEEN   WBC, Wet Prep HPF POC MODERATE (A) NONE SEEN   Sperm NONE SEEN    Imaging Studies:  Cx length 1.9cm  Assessment: Tammy Berg is  25 y.o. [id number] at 5375w4d presents with short cervix, preterm dilation.  Plan: Discussed POC with Dr. Vergie LivingPickens .   Meds ordered this encounter  Medications  . magnesium bolus via infusion 4 g  . magnesium sulfate 40 g in lactated ringers 500 mL (0.08 g/mL) OB infusion  . betamethasone acetate-betamethasone sodium phosphate (CELESTONE) injection 12 mg  . lactated ringers infusion   GBS/GC/CHL cultures collected and pending NICU is full, so [facility]  CRESENZO-DISHMAN,Maribel Hadley 12/7/201711:50 PM

## 2016-06-25 DIAGNOSIS — O26892 Other specified pregnancy related conditions, second trimester: Secondary | ICD-10-CM | POA: Diagnosis present

## 2016-06-25 DIAGNOSIS — O26872 Cervical shortening, second trimester: Secondary | ICD-10-CM | POA: Diagnosis not present

## 2016-06-25 DIAGNOSIS — Z3A26 26 weeks gestation of pregnancy: Secondary | ICD-10-CM | POA: Diagnosis not present

## 2016-06-25 LAB — GC/CHLAMYDIA PROBE AMP (~~LOC~~) NOT AT ARMC
Chlamydia: NEGATIVE
Neisseria Gonorrhea: NEGATIVE

## 2016-06-26 LAB — CULTURE, BETA STREP (GROUP B ONLY)

## 2016-06-27 ENCOUNTER — Telehealth: Payer: Self-pay | Admitting: Nurse Practitioner

## 2016-06-27 NOTE — Telephone Encounter (Signed)
Urine culture results received and noted +GBS.  Called the An[facility] Unit [facility] client was [facility].  Spoke with her nurse and gave these results verbally.  RN states she will notify the attending of +GBS and document on the sticky note on the chart in EPIC.  Nolene Bernheimerri Burleson, NP

## 2016-06-29 ENCOUNTER — Encounter: Payer: Self-pay | Admitting: Obstetrics and Gynecology

## 2016-06-29 DIAGNOSIS — O99820 Streptococcus B carrier state complicating pregnancy: Secondary | ICD-10-CM | POA: Insufficient documentation

## 2016-11-18 ENCOUNTER — Encounter (HOSPITAL_BASED_OUTPATIENT_CLINIC_OR_DEPARTMENT_OTHER): Payer: Self-pay | Admitting: *Deleted

## 2016-11-18 ENCOUNTER — Emergency Department (HOSPITAL_BASED_OUTPATIENT_CLINIC_OR_DEPARTMENT_OTHER)
Admission: EM | Admit: 2016-11-18 | Discharge: 2016-11-18 | Disposition: A | Discharge status: Home / Self Care | Payer: Managed Care, Other (non HMO) | Attending: Emergency Medicine | Admitting: Emergency Medicine

## 2016-11-18 DIAGNOSIS — J029 Acute pharyngitis, unspecified: Secondary | ICD-10-CM | POA: Diagnosis not present

## 2016-11-18 MED ORDER — AMOXICILLIN 500 MG PO CAPS: 500.0000 mg | ORAL_CAPSULE | Freq: Three times a day (TID) | ORAL | 0 refills | Status: AC

## 2016-11-18 MED ORDER — PHENOL 1.4 % MT LIQD: 1.0000 | OROMUCOSAL | 0 refills | Status: AC | PRN

## 2016-11-18 NOTE — ED Triage Notes (Signed)
Sore throat x 3-4 days. [facility].

## 2016-11-18 NOTE — ED Provider Notes (Signed)
[facility] Provider Note   CSN: [id number] Arrival date & time: 11/18/16  1759     History   Chief Complaint Chief Complaint  Patient presents with  . Sore Throat    HPI Tammy Berg is a 26 y.o. female.  The history is provided by the patient and medical records.  Sore Throat     25 y.o. F with hx of lymphedema, presenting to the ED for sore throat.  He states this is been ongoing for several days now. States she has generalized pain when swelling, but she does have some "soreness" on the left side of her neck as well. She's not had any fever or chills. No trouble swallowing. States she has had a dry cough as well.  No sick contacts.  States she had a negative strep test at urgent care yesterday.  States pain seems worse now.   Has not tried any OTC meds.  Past Medical History:  Diagnosis Date  . Lymphedema   . Pregnancy induced hypertension   . Vaginal Pap smear, abnormal     Patient Active Problem List   Diagnosis Date Noted  . GBS (group B Streptococcus carrier), +RV culture, currently pregnant 06/29/2016    Past Surgical History:  Procedure Laterality Date  . CERVICAL BIOPSY  2014  . INCISION AND DRAINAGE ABSCESS  2012   back of throat    OB History    Gravida Para Term Preterm AB Living   3 2 2     2    SAB TAB Ectopic Multiple Live Births           2       Home Medications    Prior to Admission medications   Medication Sig Start Date End Date Taking? Authorizing Provider  acetaminophen (TYLENOL) 500 MG tablet Take 1,000 mg by mouth every 6 (six) hours as needed for headache.   Yes Historical Provider, MD  ibuprofen (ADVIL,MOTRIN) 800 MG tablet Take 1 tablet (800 mg total) by mouth every 8 (eight) hours as needed for mild pain. 08/14/15   Kristen N Ward, DO  prenatal vitamin w/FE, FA (NATACHEW) 29-1 MG CHEW chewable tablet Chew 1 tablet by mouth daily at 12 noon. 06/10/13   Armando Reichert, CNM    Family History No family history on  file.  Social History Social History  Substance Use Topics  . Smoking status: Never Smoker  . Smokeless tobacco: Never Used  . Alcohol use No     Allergies   Patient has no known allergies.   Review of Systems Review of Systems  HENT: Positive for sore throat.   All other systems reviewed and are negative.    Physical Exam Updated Vital Signs BP 134/90   Pulse 67   Temp 98 F (36.7 C) (Oral)   Resp (!) 21   Ht 5\' 5"  (1.651 m)   Wt 95.3 kg   LMP 11/16/2015   SpO2 100%   Breastfeeding? Unknown   BMI 34.95 kg/m   Physical Exam  Constitutional: She is oriented to person, place, and time. She appears well-developed and well-nourished.  HENT:  Head: Normocephalic and atraumatic.  Mouth/Throat: Oropharynx is clear and moist.  Tonsils 1+ bilaterally without exudate; uvula midline without evidence of peritonsillar abscess; handling secretions appropriately; no difficulty swallowing or speaking; normal phonation without stridor  Eyes: Conjunctivae and EOM are normal. Pupils are equal, round, and reactive to light.  Neck: Normal range of motion.  Cardiovascular: Normal rate, regular  rhythm and normal heart sounds.   Pulmonary/Chest: Effort normal and breath sounds normal. No respiratory distress. She has no wheezes.  Abdominal: Soft. Bowel sounds are normal.  Musculoskeletal: Normal range of motion.  Lymphadenopathy:    She has cervical adenopathy.  Left cervical lymphadenopathy that is tender to palpation, no noted abscess formation or cellulitis  Neurological: She is alert and oriented to person, place, and time.  Skin: Skin is warm and dry.  Psychiatric: She has a normal mood and affect.  Nursing note and vitals reviewed.    ED Treatments / Results  Labs (all labs ordered are listed, but only abnormal results are displayed) Labs Reviewed - No data to display  EKG  EKG Interpretation None       Radiology No results found.  Procedures Procedures  (including critical care time)  Medications Ordered in ED Medications - No data to display   Initial Impression / Assessment and Plan / ED Course  I have reviewed the triage vital signs and the nursing notes.  Pertinent labs & imaging results that were available during my care of the patient were reviewed by me and considered in my medical decision making (see chart for details).  26 year old female here with sore throat. Has been ongoing for a few days now. She had a rapid strep test yesterday at urgent care. She is afebrile and nontoxic. She does have 1+ tonsillar edema without exudates. Uvula remains midline without evidence of peritonsillar abscess. She is handling her secretions well, normal phonation without stridor. She does have some left-sided lymphadenopathy which is TTP. Suspect this is reactive. She has no signs concerning for meningitis. We'll treat with amoxicillin.  Work note given.  Discussed plan with patient, she acknowledged understanding and agreed with plan of care.  Return precautions given for new or worsening symptoms.  Final Clinical Impressions(s) / ED Diagnoses   Final diagnoses:  Sore throat    New Prescriptions Discharge Medication List as of 11/18/2016  6:29 PM    START taking these medications   Details  amoxicillin (AMOXIL) 500 MG capsule Take 1 capsule (500 mg total) by mouth 3 (three) times daily., Starting Thu 11/18/2016, Print    phenol (CHLORASEPTIC) 1.4 % LIQD Use as directed 1 spray in the mouth or throat as needed for throat irritation / pain., Starting Thu 11/18/2016, Print         Garlon HatchetLisa M Percell Lamboy, PA-C 11/18/16 1858    Canary Brimhristopher J Tegeler, MD 11/19/16 1504

## 2016-11-18 NOTE — Discharge Instructions (Signed)
Take the prescribed medication as directed. °Follow-up with your primary care doctor. °Return to the ED for new or worsening symptoms. °

## 2018-01-29 ENCOUNTER — Other Ambulatory Visit: Payer: Self-pay

## 2018-01-29 ENCOUNTER — Encounter (HOSPITAL_BASED_OUTPATIENT_CLINIC_OR_DEPARTMENT_OTHER): Payer: Self-pay | Admitting: Emergency Medicine

## 2018-01-29 ENCOUNTER — Emergency Department (HOSPITAL_BASED_OUTPATIENT_CLINIC_OR_DEPARTMENT_OTHER)
Admission: EM | Admit: 2018-01-29 | Discharge: 2018-01-29 | Disposition: A | Discharge status: Home / Self Care | Payer: Managed Care, Other (non HMO) | Attending: Emergency Medicine | Admitting: Emergency Medicine

## 2018-01-29 DIAGNOSIS — Y929 Unspecified place or not applicable: Secondary | ICD-10-CM | POA: Insufficient documentation

## 2018-01-29 DIAGNOSIS — Z79899 Other long term (current) drug therapy: Secondary | ICD-10-CM | POA: Diagnosis not present

## 2018-01-29 DIAGNOSIS — S40021A Contusion of right upper arm, initial encounter: Secondary | ICD-10-CM | POA: Insufficient documentation

## 2018-01-29 DIAGNOSIS — Y9389 Activity, other specified: Secondary | ICD-10-CM | POA: Insufficient documentation

## 2018-01-29 DIAGNOSIS — X58XXXA Exposure to other specified factors, initial encounter: Secondary | ICD-10-CM | POA: Diagnosis not present

## 2018-01-29 DIAGNOSIS — Y999 Unspecified external cause status: Secondary | ICD-10-CM | POA: Insufficient documentation

## 2018-01-29 NOTE — ED Notes (Signed)
Pt verbalized understanding of discharge instructions.

## 2018-01-29 NOTE — Discharge Instructions (Addendum)
Please complete warm compresses multiple times per day to help with bruising. You may take tylenol and ibuprofen for your symptoms. Follow up with your primary care doctor in 1 week and return for any new or worsening symptoms.

## 2018-01-29 NOTE — ED Provider Notes (Signed)
[facility] EMERGENCY DEPARTMENT Provider Note   CSN: [id number] Arrival date & time: 01/29/18  1023     History   Chief Complaint Chief Complaint  Patient presents with  . Bleeding/Bruising    HPI Tammy Berg is a 27 y.o. female.  HPI   Patient is a 27 year old female with a history of lymphedema who presents emergency department today for evaluation of bruising to the right upper extremity that has been present for the last 4 days.  She states that she donated plasma 4 days ago and bent her arm while the needle was in place.  She states that she had pain at that time and she was told that her vein was blown.  Since then she has developed bruising to the area and has had some pain to the areas of bruising.  States pain is constant and worse with palpation.  Has gradually worsened since onset.  She has not tried any intervention for her symptoms.  Denies any other exacerbating or alleviating factors. She has no other symptoms.    Past Medical History:  Diagnosis Date  . Lymphedema   . Pregnancy induced hypertension   . Vaginal Pap smear, abnormal     Patient Active Problem List   Diagnosis Date Noted  . GBS (group B Streptococcus carrier), +RV culture, currently pregnant 06/29/2016    Past Surgical History:  Procedure Laterality Date  . CERVICAL BIOPSY  2014  . INCISION AND DRAINAGE ABSCESS  2012   back of throat     OB History    Gravida  3   Para  2   Term  2   Preterm      AB      Living  2     SAB      TAB      Ectopic      Multiple      Live Births  2            Home Medications    Prior to Admission medications   Medication Sig Start Date End Date Taking? Authorizing Provider  acetaminophen (TYLENOL) 500 MG tablet Take 1,000 mg by mouth every 6 (six) hours as needed for headache.    [provider]  amoxicillin (AMOXIL) 500 MG capsule Take 1 capsule (500 mg total) by mouth 3 (three) times daily. 11/18/16    Garlon HatchetSanders, Lisa M, PA-C  ibuprofen (ADVIL,MOTRIN) 800 MG tablet Take 1 tablet (800 mg total) by mouth every 8 (eight) hours as needed for mild pain. 08/14/15   Ward, Layla MawKristen N, DO  phenol (CHLORASEPTIC) 1.4 % LIQD Use as directed 1 spray in the mouth or throat as needed for throat irritation / pain. 11/18/16   Garlon HatchetSanders, Lisa M, PA-C  prenatal vitamin w/FE, FA (NATACHEW) 29-1 MG CHEW chewable tablet Chew 1 tablet by mouth daily at 12 noon. 06/10/13   Armando ReichertHogan, Heather D, CNM    Family History No family history on file.  Social History Social History   Tobacco Use  . Smoking status: Never Smoker  . Smokeless tobacco: Never Used  Substance Use Topics  . Alcohol use: No  . Drug use: No     Allergies   Patient has no known allergies.   Review of Systems Review of Systems  Constitutional: Negative for fever.  Eyes: Negative for visual disturbance.  Respiratory: Negative for shortness of breath.   Cardiovascular: Negative for chest pain.  Musculoskeletal:       RUE pain  and bruising  Skin: Positive for color change.  Neurological: Negative for headaches.     Physical Exam Updated Vital Signs BP 121/74 (BP Location: Left Arm)   Pulse 60   Temp 98.3 F (36.8 C) (Oral)   Resp 16   Ht 5\' 5"  (1.651 m)   Wt 95.3 kg (210 lb)   LMP 12/30/2017   SpO2 100%   BMI 34.95 kg/m   Physical Exam  Constitutional: She appears well-developed and well-nourished. No distress.  HENT:  Head: Normocephalic and atraumatic.  Eyes: Conjunctivae are normal.  Neck: Neck supple.  Cardiovascular: Normal rate, regular rhythm and intact distal pulses.  No murmur heard. Pulmonary/Chest: Effort normal and breath sounds normal. She has no wheezes.  Abdominal: Soft. There is no tenderness.  Musculoskeletal:  Ecchymosis and TTP to the proximal RUE. No swelling, erythema, or edema. Distal pulses intact. Sensation and strength normal.   Neurological: She is alert.  Skin: Skin is warm and dry.  Psychiatric:  She has a normal mood and affect.  Nursing note and vitals reviewed.   ED Treatments / Results  Labs (all labs ordered are listed, but only abnormal results are displayed) Labs Reviewed - No data to display  EKG None  Radiology No results found.  Procedures Procedures (including critical care time)  Medications Ordered in ED Medications - No data to display   Initial Impression / Assessment and Plan / ED Course  I have reviewed the triage vital signs and the nursing notes.  Pertinent labs & imaging results that were available during my care of the patient were reviewed by me and considered in my medical decision making (see chart for details).     Final Clinical Impressions(s) / ED Diagnoses   Final diagnoses:  Superficial bruising of arm, right, initial encounter   Patient presenting with ecchymosis and pain to the right upper extremity after she donated plasma 4 days ago and bend her arm while the needle was in her arm.  Vital signs stable.  Afebrile.  No other symptoms no chest pain or shortness of breath.  Patient symptoms likely due to infiltration of her vein while donating plasma.  Advised her to complete warm compresses to the area multiple times per day and to take Tylenol and Motrin if she has pain.  Advised follow-up with PCP and return precautions discussed.  All questions answered and patient stable for discharge.  ED Discharge Orders    None       Rayne Du 01/29/18 1057    Tilden Fossa, MD 01/29/18 [id number]

## 2018-01-29 NOTE — ED Triage Notes (Signed)
Bruise to upper R arm since Thursday after giving plasma.

## 2018-07-03 ENCOUNTER — Encounter (HOSPITAL_BASED_OUTPATIENT_CLINIC_OR_DEPARTMENT_OTHER): Payer: Self-pay | Admitting: *Deleted

## 2018-07-03 ENCOUNTER — Other Ambulatory Visit: Payer: Self-pay

## 2018-07-03 ENCOUNTER — Emergency Department (HOSPITAL_BASED_OUTPATIENT_CLINIC_OR_DEPARTMENT_OTHER): Payer: Managed Care, Other (non HMO)

## 2018-07-03 ENCOUNTER — Emergency Department (HOSPITAL_BASED_OUTPATIENT_CLINIC_OR_DEPARTMENT_OTHER)
Admission: EM | Admit: 2018-07-03 | Discharge: 2018-07-03 | Disposition: A | Discharge status: Home / Self Care | Payer: Managed Care, Other (non HMO) | Attending: Emergency Medicine | Admitting: Emergency Medicine

## 2018-07-03 DIAGNOSIS — R600 Localized edema: Secondary | ICD-10-CM | POA: Insufficient documentation

## 2018-07-03 DIAGNOSIS — Z3201 Encounter for pregnancy test, result positive: Secondary | ICD-10-CM | POA: Diagnosis not present

## 2018-07-03 DIAGNOSIS — Z79899 Other long term (current) drug therapy: Secondary | ICD-10-CM | POA: Insufficient documentation

## 2018-07-03 LAB — PREGNANCY, URINE: PREG TEST UR: POSITIVE — AB

## 2018-07-03 NOTE — ED Triage Notes (Signed)
Left leg pain. Swelling to her ankle. No injury. States she has swelling every now and then and has had US test to r/o DVT and it has been negative.

## 2018-07-03 NOTE — Discharge Instructions (Addendum)
There is no clot today on your ultrasound.  Follow-up with your gynecologist.  Continue to take your prenatal vitamins.  Please return to the ED if you have any severe abdominal pain, vaginal bleeding.

## 2018-07-03 NOTE — ED Provider Notes (Signed)
[facility] EMERGENCY DEPARTMENT Provider Note   CSN: [id number] Arrival date & time: 07/03/18  1549     History   Chief Complaint Chief Complaint  Patient presents with  . Leg Swelling    HPI Tammy Berg is a 27 y.o. female.  The history is provided by the patient.  Illness  This is a recurrent problem. The current episode started more than 2 days ago. The problem occurs constantly. The problem has not changed since onset.Pertinent negatives include no chest pain, no abdominal pain, no headaches and no shortness of breath. Nothing aggravates the symptoms. Nothing relieves the symptoms. She has tried nothing for the symptoms. The treatment provided no relief.    Past Medical History:  Diagnosis Date  . Lymphedema   . Pregnancy induced hypertension   . Vaginal Pap smear, abnormal     Patient Active Problem List   Diagnosis Date Noted  . GBS (group B Streptococcus carrier), +RV culture, currently pregnant 06/29/2016    Past Surgical History:  Procedure Laterality Date  . CERVICAL BIOPSY  2014  . INCISION AND DRAINAGE ABSCESS  2012   back of throat     OB History    Gravida  3   Para  2   Term  2   Preterm      AB      Living  2     SAB      TAB      Ectopic      Multiple      Live Births  2            Home Medications    Prior to Admission medications   Medication Sig Start Date End Date Taking? Authorizing Provider  acetaminophen (TYLENOL) 500 MG tablet Take 1,000 mg by mouth every 6 (six) hours as needed for headache.    [provider]  amoxicillin (AMOXIL) 500 MG capsule Take 1 capsule (500 mg total) by mouth 3 (three) times daily. 11/18/16   Garlon Hatchet, PA-C  ibuprofen (ADVIL,MOTRIN) 800 MG tablet Take 1 tablet (800 mg total) by mouth every 8 (eight) hours as needed for mild pain. 08/14/15   Ward, Layla Maw, DO  phenol (CHLORASEPTIC) 1.4 % LIQD Use as directed 1 spray in the mouth or throat as needed for  throat irritation / pain. 11/18/16   Garlon Hatchet, PA-C  prenatal vitamin w/FE, FA (NATACHEW) 29-1 MG CHEW chewable tablet Chew 1 tablet by mouth daily at 12 noon. 06/10/13   Armando Reichert, CNM    Family History No family history on file.  Social History Social History   Tobacco Use  . Smoking status: Never Smoker  . Smokeless tobacco: Never Used  Substance Use Topics  . Alcohol use: No  . Drug use: No     Allergies   Patient has no known allergies.   Review of Systems Review of Systems  Constitutional: Negative for chills and fever.  HENT: Negative for ear pain and sore throat.   Eyes: Negative for pain and visual disturbance.  Respiratory: Negative for cough and shortness of breath.   Cardiovascular: Positive for leg swelling. Negative for chest pain and palpitations.  Gastrointestinal: Negative for abdominal pain and vomiting.  Genitourinary: Negative for dysuria and hematuria.  Musculoskeletal: Negative for arthralgias and back pain.  Skin: Negative for color change and rash.  Neurological: Negative for seizures, syncope and headaches.  All other systems reviewed and are negative.  Physical Exam Updated Vital Signs BP 124/73 (BP Location: Right Arm)   Pulse 70   Temp 98.5 F (36.9 C) (Oral)   Resp 18   Ht 5' 5.5" (1.664 m)   Wt 99.8 kg   LMP 06/03/2018   SpO2 100%   BMI 36.05 kg/m   Physical Exam Vitals signs and nursing note reviewed.  Constitutional:      General: She is not in acute distress.    Appearance: She is well-developed.  HENT:     Head: Normocephalic and atraumatic.     Nose: Nose normal.     Mouth/Throat:     Mouth: Mucous membranes are moist.  Eyes:     Conjunctiva/sclera: Conjunctivae normal.  Neck:     Musculoskeletal: Normal range of motion and neck supple.  Cardiovascular:     Rate and Rhythm: Normal rate and regular rhythm.     Pulses: Normal pulses.     Heart sounds: Normal heart sounds. No murmur.  Pulmonary:      Effort: Pulmonary effort is normal. No respiratory distress.     Breath sounds: Normal breath sounds.  Abdominal:     General: There is no distension.     Palpations: Abdomen is soft. There is no mass.     Tenderness: There is no abdominal tenderness. There is no guarding or rebound.     Hernia: No hernia is present.  Musculoskeletal:        General: Swelling (left leg swollen compared to the right) present. No tenderness or deformity.     Right lower leg: No edema.     Left lower leg: Edema present.  Skin:    General: Skin is warm and dry.     Capillary Refill: Capillary refill takes less than 2 seconds.  Neurological:     General: No focal deficit present.     Mental Status: She is alert.  Psychiatric:        Mood and Affect: Mood normal.      ED Treatments / Results  Labs (all labs ordered are listed, but only abnormal results are displayed) Labs Reviewed  PREGNANCY, URINE - Abnormal; Notable for the following components:      Result Value   Preg Test, Ur POSITIVE (*)    All other components within normal limits    EKG None  Radiology Dg Ankle Complete Left  Result Date: 07/03/2018 CLINICAL DATA:  Left ankle swelling for 1 week, no known injury, initial encounter EXAM: LEFT ANKLE COMPLETE - 3+ VIEW COMPARISON:  None. FINDINGS: Generalized soft tissue swelling is noted about the ankle. No acute fracture or dislocation is seen. IMPRESSION: Soft tissue swelling without acute bony abnormality. Electronically Signed   By: Alcide CleverMark  Lukens M.D.   On: 07/03/2018 17:39   [place] Venous Img Lower  Left (dvt Study)  Result Date: 07/03/2018 CLINICAL DATA:  Left lower extremity pain and swelling for 3 days EXAM: LEFT LOWER EXTREMITY VENOUS DOPPLER ULTRASOUND TECHNIQUE: Gray-scale sonography with graded compression, as well as color Doppler and duplex ultrasound were performed to evaluate the lower extremity deep venous systems from the level of the common femoral vein and including the  common femoral, femoral, profunda femoral, popliteal and calf veins including the posterior tibial, peroneal and gastrocnemius veins when visible. The superficial great saphenous vein was also interrogated. Spectral Doppler was utilized to evaluate flow at rest and with distal augmentation maneuvers in the common femoral, femoral and popliteal veins. COMPARISON:  None. FINDINGS: Contralateral Common Femoral  Vein: Respiratory phasicity is normal and symmetric with the symptomatic side. No evidence of thrombus. Normal compressibility. Common Femoral Vein: No evidence of thrombus. Normal compressibility, respiratory phasicity and response to augmentation. Saphenofemoral Junction: No evidence of thrombus. Normal compressibility and flow on color Doppler imaging. Profunda Femoral Vein: No evidence of thrombus. Normal compressibility and flow on color Doppler imaging. Femoral Vein: No evidence of thrombus. Normal compressibility, respiratory phasicity and response to augmentation. Popliteal Vein: No evidence of thrombus. Normal compressibility, respiratory phasicity and response to augmentation. Calf Veins: No evidence of thrombus. Normal compressibility and flow on color Doppler imaging. Superficial Great Saphenous Vein: No evidence of thrombus. Normal compressibility. Venous Reflux:  None. Other Findings:  None. IMPRESSION: No evidence of deep venous thrombosis. Electronically Signed   By: Alcide Clever M.D.   On: 07/03/2018 20:57    Procedures Procedures (including critical care time)  Medications Ordered in ED Medications - No data to display   Initial Impression / Assessment and Plan / ED Course  I have reviewed the triage vital signs and the nursing notes.  Pertinent labs & imaging results that were available during my care of the patient were reviewed by me and considered in my medical decision making (see chart for details).     Tammy Berg is a 27 year old female with history of lymphedema in  left leg who presents to the ED with left leg swelling.  Patient with normal vitals.  No fever.  Patient has intermittent swelling of her left leg in the past.  Has had negative DVT studies in past.  Patient denies any injury.  She denies any specific pain.  Left leg is swollen mostly in the foot and ankle compared to the right leg.  Patient states she is 2 days late for her period and would like a pregnancy test as well.  No abdominal pain, no vaginal bleeding.  Pregnancy test was positive.  No concern for ectopic pregnancy.  Given education about pregnancy.  Recommend prenatal vitamin.  X-ray of the left lower ankle unremarkable.  DVT study within normal limits.  Patient likely with ongoing chronic process.  Recommend compression stockings.  Discharged from ED in good condition.  Given return precautions.  This chart was dictated using voice recognition software.  Despite best efforts to proofread,  errors can occur which can change the documentation meaning.   Final Clinical Impressions(s) / ED Diagnoses   Final diagnoses:  Leg edema    ED Discharge Orders    None       Virgina Norfolk, DO 07/04/18 0050

## 2019-09-23 ENCOUNTER — Encounter (HOSPITAL_BASED_OUTPATIENT_CLINIC_OR_DEPARTMENT_OTHER): Payer: Self-pay | Admitting: Emergency Medicine

## 2019-09-23 ENCOUNTER — Emergency Department (HOSPITAL_BASED_OUTPATIENT_CLINIC_OR_DEPARTMENT_OTHER)
Admission: EM | Admit: 2019-09-23 | Discharge: 2019-09-23 | Disposition: A | Discharge status: Home / Self Care | Payer: Medicaid Other | Attending: Emergency Medicine | Admitting: Emergency Medicine

## 2019-09-23 ENCOUNTER — Other Ambulatory Visit: Payer: Self-pay

## 2019-09-23 DIAGNOSIS — S40262A Insect bite (nonvenomous) of left shoulder, initial encounter: Secondary | ICD-10-CM | POA: Diagnosis not present

## 2019-09-23 DIAGNOSIS — S20469A Insect bite (nonvenomous) of unspecified back wall of thorax, initial encounter: Secondary | ICD-10-CM | POA: Diagnosis not present

## 2019-09-23 DIAGNOSIS — Y939 Activity, unspecified: Secondary | ICD-10-CM | POA: Diagnosis not present

## 2019-09-23 DIAGNOSIS — S60562A Insect bite (nonvenomous) of left hand, initial encounter: Secondary | ICD-10-CM | POA: Insufficient documentation

## 2019-09-23 DIAGNOSIS — S50862A Insect bite (nonvenomous) of left forearm, initial encounter: Secondary | ICD-10-CM | POA: Diagnosis not present

## 2019-09-23 DIAGNOSIS — Y9259 Other trade areas as the place of occurrence of the external cause: Secondary | ICD-10-CM | POA: Diagnosis not present

## 2019-09-23 DIAGNOSIS — W57XXXA Bitten or stung by nonvenomous insect and other nonvenomous arthropods, initial encounter: Secondary | ICD-10-CM | POA: Diagnosis not present

## 2019-09-23 DIAGNOSIS — S60862A Insect bite (nonvenomous) of left wrist, initial encounter: Secondary | ICD-10-CM | POA: Insufficient documentation

## 2019-09-23 DIAGNOSIS — S60861A Insect bite (nonvenomous) of right wrist, initial encounter: Secondary | ICD-10-CM | POA: Insufficient documentation

## 2019-09-23 DIAGNOSIS — S60561A Insect bite (nonvenomous) of right hand, initial encounter: Secondary | ICD-10-CM | POA: Insufficient documentation

## 2019-09-23 DIAGNOSIS — S40261A Insect bite (nonvenomous) of right shoulder, initial encounter: Secondary | ICD-10-CM | POA: Insufficient documentation

## 2019-09-23 DIAGNOSIS — S20369A Insect bite (nonvenomous) of unspecified front wall of thorax, initial encounter: Secondary | ICD-10-CM | POA: Insufficient documentation

## 2019-09-23 DIAGNOSIS — S50861A Insect bite (nonvenomous) of right forearm, initial encounter: Secondary | ICD-10-CM | POA: Diagnosis not present

## 2019-09-23 DIAGNOSIS — Y999 Unspecified external cause status: Secondary | ICD-10-CM | POA: Diagnosis not present

## 2019-09-23 MED ORDER — TRIAMCINOLONE ACETONIDE 0.1 % EX CREA: 1.0000 "application " | TOPICAL_CREAM | Freq: Two times a day (BID) | CUTANEOUS | 1 refills | Status: AC

## 2019-09-23 MED ORDER — HYDROXYZINE HCL 25 MG PO TABS: 25.0000 mg | ORAL_TABLET | Freq: Four times a day (QID) | ORAL | 0 refills | Status: AC

## 2019-09-23 NOTE — Discharge Instructions (Signed)
Please read and follow all provided instructions.  Your diagnoses today include:  1. Bedbug bite, initial encounter     Tests performed today include:  Vital signs. See below for your results today.   Medications prescribed:   Triamcinolone (Kenalog) cream - topical steroid medication for skin reaction   Hydroxyzine - antihistamine  You can find this medication over-the-counter.   This medication will make you drowsy. DO NOT drive or perform any activities that require you to be awake and alert if taking this.  Take any prescribed medications only as directed.  Home care instructions:   Follow any educational materials contained in this packet  Follow-up instructions: Please follow-up with your primary care provider as needed for further evaluation of your symptoms.   Return instructions:   Please return to the Emergency Department if you experience worsening symptoms.   Call 9-1-1 immediately if you have an allergic reaction that involves your lips, mouth, throat or if you have any difficulty breathing. This is a life-threatening emergency.   Please return if you have any other emergent concerns.  Additional Information:  Your vital signs today were: BP 121/82 (BP Location: Left Arm)   Pulse 78   Resp 18   Ht 5\' 9"  (1.753 m)   Wt 99.8 kg   LMP 08/17/2019   SpO2 100%   Breastfeeding No   BMI 32.49 kg/m  If your blood pressure (BP) was elevated above 135/85 this visit, please have this repeated by your doctor within one month. --------------

## 2019-09-23 NOTE — ED Provider Notes (Signed)
[facility] EMERGENCY DEPARTMENT Provider Note   CSN: [id number] Arrival date & time: 09/23/19  1620     History Chief Complaint  Patient presents with  . Rash    Tammy Berg is a 29 y.o. female.  Pt presents with c/o itchy bumps on the chest, back, and arms x 1 day. Patient was staying at a hotel. She woke up itching and found bugs on the sheets. No tongue or lip swelling, N/V/D, syncope. She has been taking benadryl and applying hydrocortisone without much relief.         Past Medical History:  Diagnosis Date  . Lymphedema   . Pregnancy induced hypertension   . Vaginal Pap smear, abnormal     Patient Active Problem List   Diagnosis Date Noted  . GBS (group B Streptococcus carrier), +RV culture, currently pregnant 06/29/2016    Past Surgical History:  Procedure Laterality Date  . CERVICAL BIOPSY  2014  . INCISION AND DRAINAGE ABSCESS  2012   back of throat     OB History    Gravida  3   Para  2   Term  2   Preterm      AB      Living  2     SAB      TAB      Ectopic      Multiple      Live Births  2           No family history on file.  Social History   Tobacco Use  . Smoking status: Never Smoker  . Smokeless tobacco: Never Used  Substance Use Topics  . Alcohol use: No  . Drug use: No    Home Medications Prior to Admission medications   Medication Sig Start Date End Date Taking? Authorizing Provider  acetaminophen (TYLENOL) 500 MG tablet Take 1,000 mg by mouth every 6 (six) hours as needed for headache.    [provider]  amoxicillin (AMOXIL) 500 MG capsule Take 1 capsule (500 mg total) by mouth 3 (three) times daily. 11/18/16   Larene Pickett, PA-C  hydrOXYzine (ATARAX/VISTARIL) 25 MG tablet Take 1 tablet (25 mg total) by mouth every 6 (six) hours. 09/23/19   Carlisle Cater, PA-C  ibuprofen (ADVIL,MOTRIN) 800 MG tablet Take 1 tablet (800 mg total) by mouth every 8 (eight) hours as needed for mild pain.  08/14/15   Ward, Delice Bison, DO  phenol (CHLORASEPTIC) 1.4 % LIQD Use as directed 1 spray in the mouth or throat as needed for throat irritation / pain. 11/18/16   Larene Pickett, PA-C  prenatal vitamin w/FE, FA (NATACHEW) 29-1 MG CHEW chewable tablet Chew 1 tablet by mouth daily at 12 noon. 06/10/13   Marcille Buffy D, CNM  triamcinolone cream (KENALOG) 0.1 % Apply 1 application topically 2 (two) times daily. 09/23/19   Carlisle Cater, PA-C    Allergies    Patient has no known allergies.  Review of Systems   Review of Systems  Constitutional: Negative for fever.  HENT: Negative for facial swelling and trouble swallowing.   Eyes: Negative for redness.  Respiratory: Negative for shortness of breath, wheezing and stridor.   Cardiovascular: Negative for chest pain.  Gastrointestinal: Negative for nausea and vomiting.  Musculoskeletal: Negative for myalgias.  Skin: Positive for rash.  Neurological: Negative for light-headedness.  Psychiatric/Behavioral: Negative for confusion.    Physical Exam Updated Vital Signs BP 121/82 (BP Location: Left Arm)   Pulse 78  Resp 18   Ht 5\' 9"  (1.753 m)   Wt 99.8 kg   LMP 08/17/2019   SpO2 100%   Breastfeeding No   BMI 32.49 kg/m   Physical Exam Vitals and nursing note reviewed.  Constitutional:      Appearance: She is well-developed.  HENT:     Head: Normocephalic and atraumatic.  Eyes:     Conjunctiva/sclera: Conjunctivae normal.  Pulmonary:     Effort: No respiratory distress.  Musculoskeletal:     Cervical back: Normal range of motion and neck supple.  Skin:    General: Skin is warm and dry.     Comments: Patient with multiple scattered wheals on hands, wrists, arms, shoulders, back, and chest.   Neurological:     Mental Status: She is alert.     ED Results / Procedures / Treatments   Labs (all labs ordered are listed, but only abnormal results are displayed) Labs Reviewed - No data to display  EKG None  Radiology No  results found.  Procedures Procedures (including critical care time)  Medications Ordered in ED Medications - No data to display  ED Course  I have reviewed the triage vital signs and the nursing notes.  Pertinent labs & imaging results that were available during my care of the patient were reviewed by me and considered in my medical decision making (see chart for details).  Patient seen and examined. No anaphylaxis.  Will treat for bed bug bites with stronger steroid cream, hydroxyzine. Discussed do not combine benadryl and hydroxyzine.   Vital signs reviewed and are as follows: BP 121/82 (BP Location: Left Arm)   Pulse 78   Resp 18   Ht 5\' 9"  (1.753 m)   Wt 99.8 kg   LMP 08/17/2019   SpO2 100%   Breastfeeding No   BMI 32.49 kg/m        MDM Rules/Calculators/A&P                      Pt with skin reaction to bed bugs. Tx as above. No infection sx or anaphylaxis.   Final Clinical Impression(s) / ED Diagnoses Final diagnoses:  Bedbug bite, initial encounter    Rx / DC Orders ED Discharge Orders         Ordered    triamcinolone cream (KENALOG) 0.1 %  2 times daily     09/23/19 1716    hydrOXYzine (ATARAX/VISTARIL) 25 MG tablet  Every 6 hours     09/23/19 1716           11/23/19, PA-C 09/23/19 1722    Renne Crigler, MD 09/23/19 [phone]

## 2019-09-23 NOTE — ED Triage Notes (Signed)
Pt presents with hives on her arms and chest onset yesterday. Pt has picture of bug that was in the bed she was sleeping in while in [facility]. Pt denies use of new detergents, soaps, lotions. Pt did get a new tattoo x 1 week.

## 2019-10-29 IMAGING — US US EXTREM LOW VENOUS*L*
1 series · 13 of 24 positions shown · non-contrast
Comparison: None.

CLINICAL DATA: Left lower extremity pain and swelling for 3 days



[Series 1: us extrem low venous*left* · 0.08mm/px · 13 of 30 slices shown]
[im 1/30]
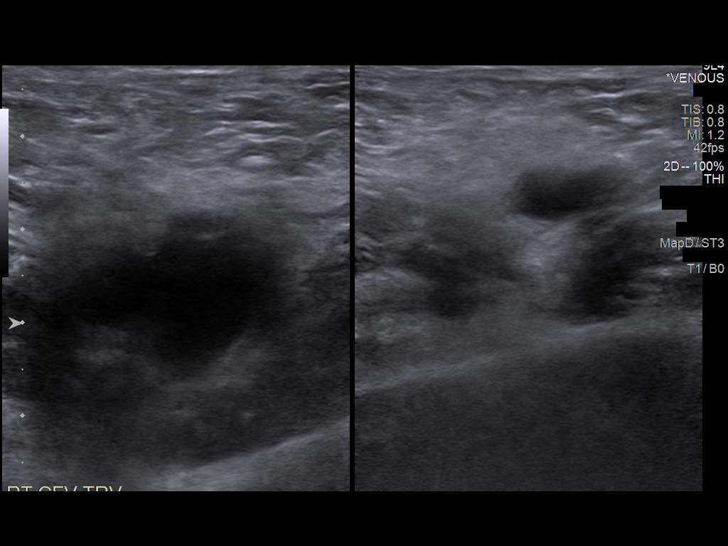
[im 3/30]
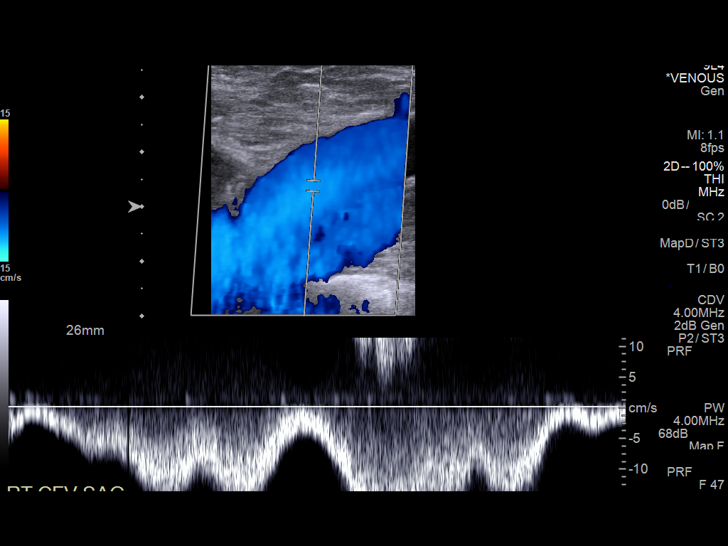
[im 6/30]
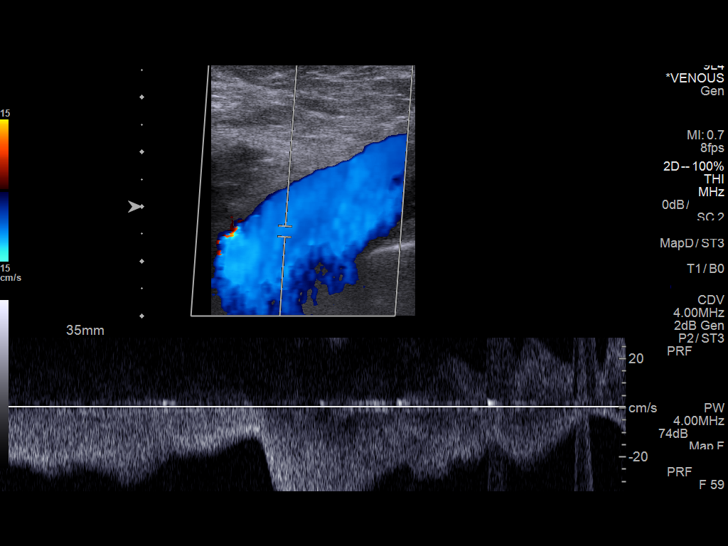
[im 8/30]
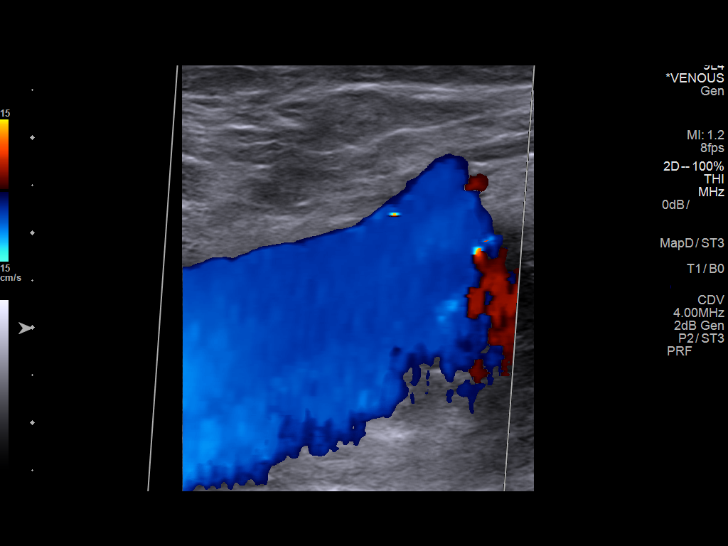
[im 11/30]
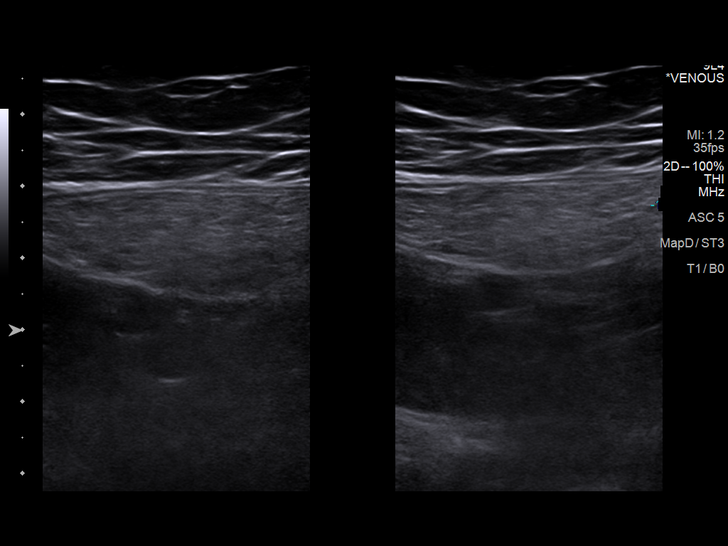
[im 13/30]
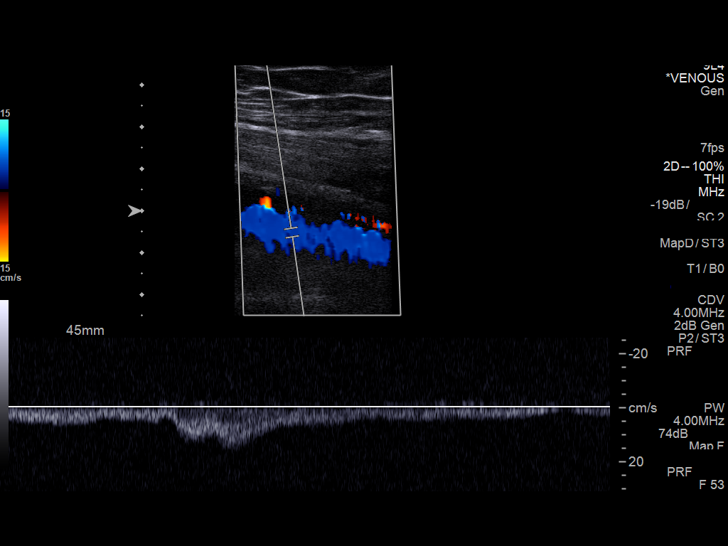
[im 16/30]
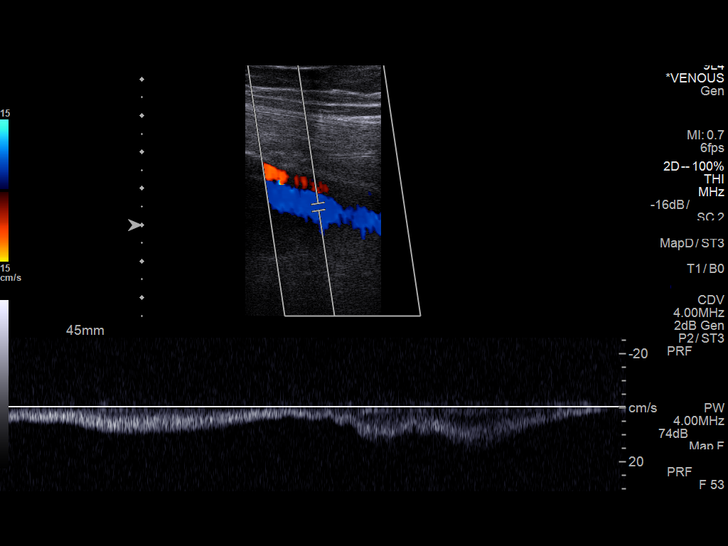
[im 17/30]
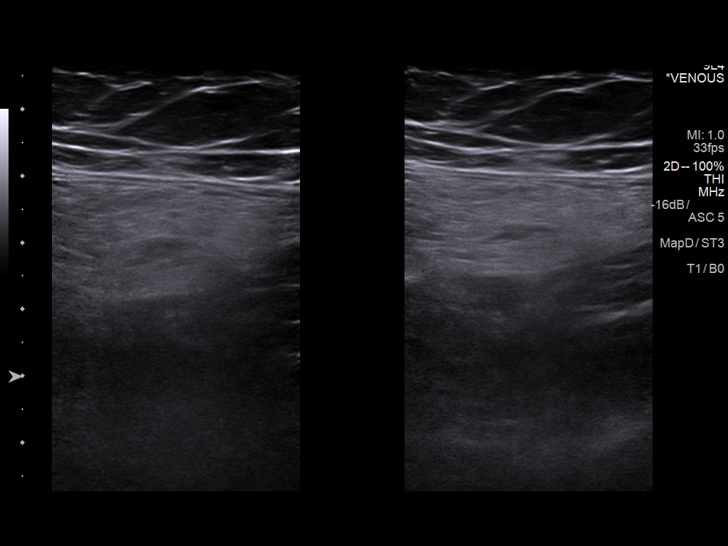
[im 19/30]
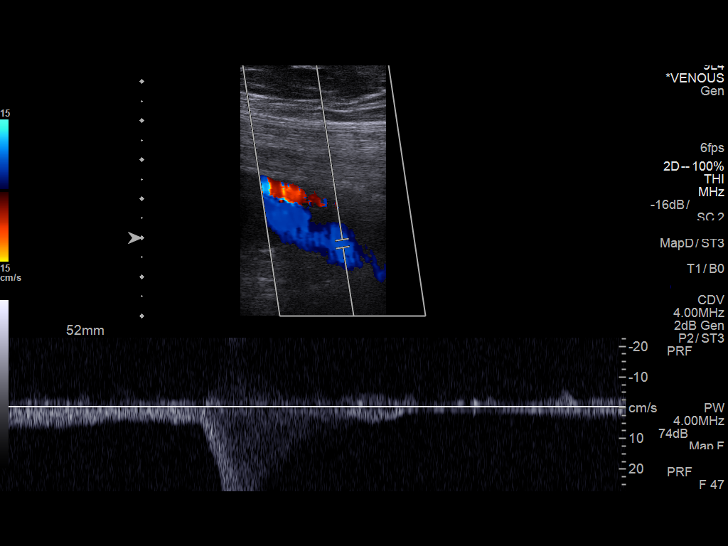
[im 22/30]
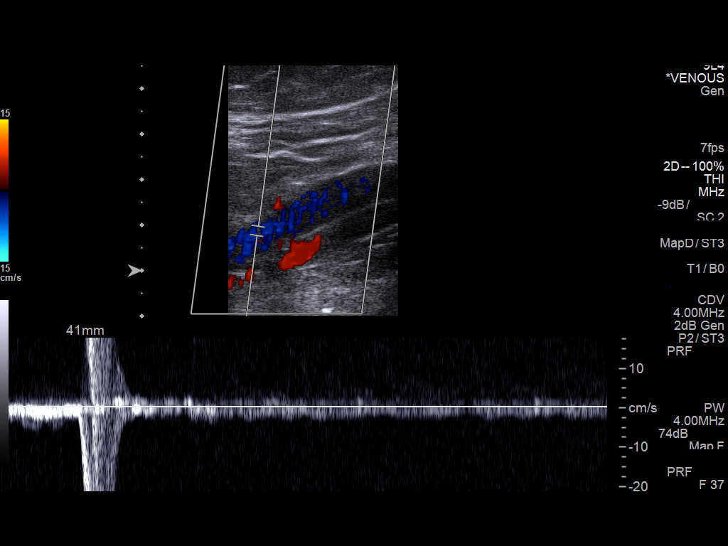
[im 24/30]
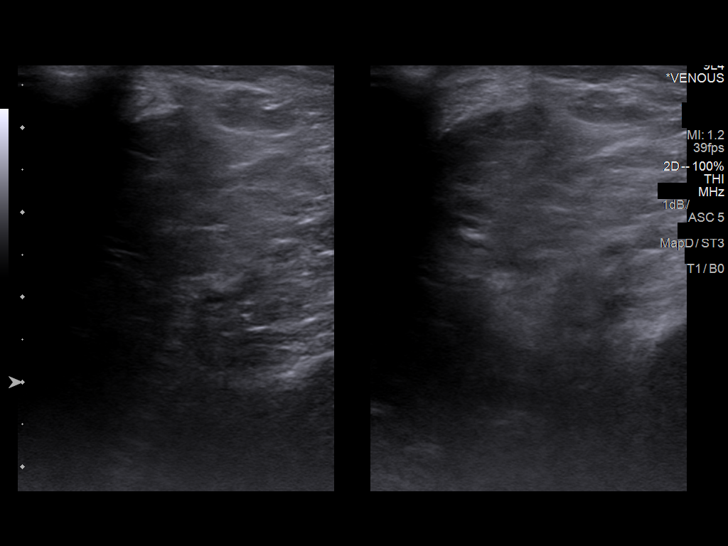
[im 27/30]
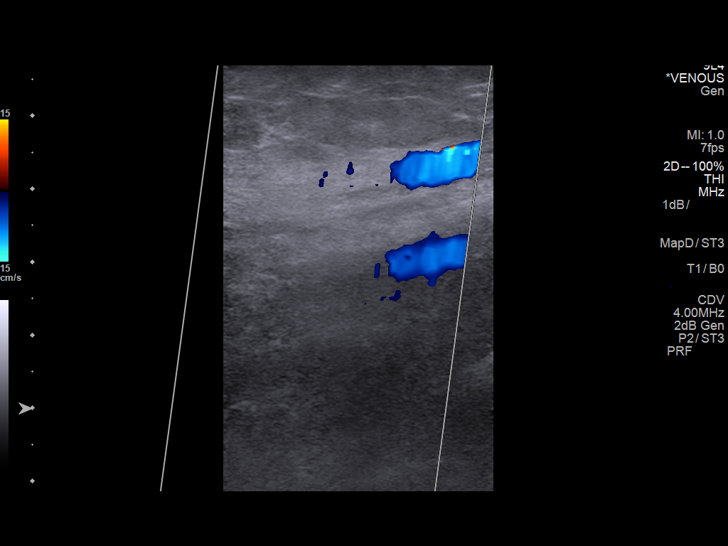
[im 30/30]
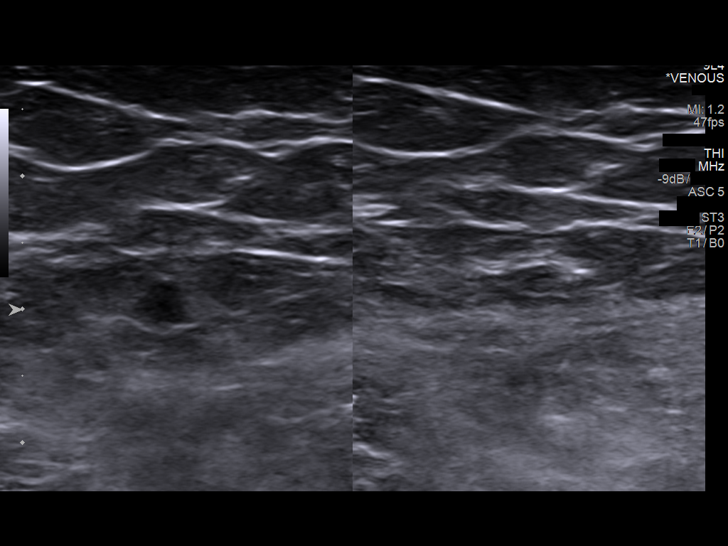

[13 of 24 positions shown; findings below may reference images not displayed]

FINDINGS: Contralateral Common Femoral Vein: Respiratory phasicity is normal
and symmetric with the symptomatic side. No evidence of thrombus.
Normal compressibility.

Common Femoral Vein: No evidence of thrombus. Normal
compressibility, respiratory phasicity and response to augmentation.

Saphenofemoral Junction: No evidence of thrombus. Normal
compressibility and flow on color Doppler imaging.

Profunda Femoral Vein: No evidence of thrombus. Normal
compressibility and flow on color Doppler imaging.

Femoral Vein: No evidence of thrombus. Normal compressibility,
respiratory phasicity and response to augmentation.

Popliteal Vein: No evidence of thrombus. Normal compressibility,
respiratory phasicity and response to augmentation.

Calf Veins: No evidence of thrombus. Normal compressibility and flow
on color Doppler imaging.

Superficial Great Saphenous Vein: No evidence of thrombus. Normal
compressibility.

Venous Reflux:  None.

Other Findings:  None.
IMPRESSION: No evidence of deep venous thrombosis.
